# Patient Record
Sex: Male | Born: 1960 | Race: White | Hispanic: No | Marital: Married | Smoking: Never smoker
Health system: Southern US, Community
[De-identification: ages and names within clinical notes are randomized; demographics above are authoritative.]

## PROBLEM LIST (undated history)

## (undated) DIAGNOSIS — M1990 Unspecified osteoarthritis, unspecified site: Secondary | ICD-10-CM

## (undated) DIAGNOSIS — H9190 Unspecified hearing loss, unspecified ear: Secondary | ICD-10-CM

## (undated) DIAGNOSIS — K219 Gastro-esophageal reflux disease without esophagitis: Secondary | ICD-10-CM

## (undated) DIAGNOSIS — R0602 Shortness of breath: Secondary | ICD-10-CM

## (undated) DIAGNOSIS — G4730 Sleep apnea, unspecified: Secondary | ICD-10-CM

## (undated) DIAGNOSIS — I10 Essential (primary) hypertension: Secondary | ICD-10-CM

## (undated) DIAGNOSIS — R7303 Prediabetes: Secondary | ICD-10-CM

## (undated) DIAGNOSIS — T7840XA Allergy, unspecified, initial encounter: Secondary | ICD-10-CM

## (undated) DIAGNOSIS — Z9889 Other specified postprocedural states: Secondary | ICD-10-CM

## (undated) DIAGNOSIS — R112 Nausea with vomiting, unspecified: Secondary | ICD-10-CM

## (undated) HISTORY — PX: KNEE ARTHROSCOPY: SUR90

## (undated) HISTORY — DX: Allergy, unspecified, initial encounter: T78.40XA

## (undated) HISTORY — DX: Sleep apnea, unspecified: G47.30

## (undated) HISTORY — PX: SEPTOPLASTY: SUR1290

## (undated) HISTORY — PX: HERNIA REPAIR: SHX51

## (undated) HISTORY — PX: TONSILLECTOMY: SUR1361

---

## 2001-01-14 ENCOUNTER — Emergency Department (HOSPITAL_COMMUNITY): Admission: EM | Admit: 2001-01-14 | Discharge: 2001-01-14 | Payer: Self-pay | Admitting: *Deleted

## 2001-01-14 ENCOUNTER — Encounter: Payer: Self-pay | Admitting: *Deleted

## 2007-02-25 ENCOUNTER — Emergency Department (HOSPITAL_COMMUNITY): Admission: EM | Admit: 2007-02-25 | Discharge: 2007-02-25 | Payer: Self-pay | Admitting: Emergency Medicine

## 2007-03-04 ENCOUNTER — Ambulatory Visit (HOSPITAL_COMMUNITY): Admission: RE | Admit: 2007-03-04 | Discharge: 2007-03-04 | Payer: Self-pay | Admitting: Family Medicine

## 2007-03-25 ENCOUNTER — Encounter: Admission: RE | Admit: 2007-03-25 | Discharge: 2007-03-25 | Payer: Self-pay | Admitting: Orthopedic Surgery

## 2007-09-18 ENCOUNTER — Ambulatory Visit (HOSPITAL_COMMUNITY): Admission: RE | Admit: 2007-09-18 | Discharge: 2007-09-18 | Payer: Self-pay | Admitting: Family Medicine

## 2007-09-18 ENCOUNTER — Emergency Department (HOSPITAL_COMMUNITY): Admission: EM | Admit: 2007-09-18 | Discharge: 2007-09-18 | Payer: Self-pay | Admitting: Emergency Medicine

## 2008-02-17 ENCOUNTER — Encounter: Admission: RE | Admit: 2008-02-17 | Discharge: 2008-04-20 | Payer: Self-pay | Admitting: Neurology

## 2008-05-19 ENCOUNTER — Ambulatory Visit: Payer: Self-pay | Admitting: Psychology

## 2008-05-26 ENCOUNTER — Ambulatory Visit: Payer: Self-pay | Admitting: Psychology

## 2008-06-07 ENCOUNTER — Ambulatory Visit: Payer: Self-pay | Admitting: Psychology

## 2008-06-28 ENCOUNTER — Ambulatory Visit: Payer: Self-pay | Admitting: Psychology

## 2008-07-18 ENCOUNTER — Ambulatory Visit: Payer: Self-pay | Admitting: Psychology

## 2008-08-05 ENCOUNTER — Ambulatory Visit: Payer: Self-pay | Admitting: Psychology

## 2008-08-19 ENCOUNTER — Ambulatory Visit: Payer: Self-pay | Admitting: Psychology
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
## 2011-12-16 ENCOUNTER — Other Ambulatory Visit: Payer: Self-pay | Admitting: Orthopedic Surgery

## 2011-12-23 ENCOUNTER — Encounter (HOSPITAL_BASED_OUTPATIENT_CLINIC_OR_DEPARTMENT_OTHER): Payer: Self-pay | Admitting: *Deleted

## 2011-12-23 NOTE — Progress Notes (Signed)
To [facility] at 0815- [place] ,Ekg on arrival-Npo after MN-will take metoprolol with sip water that am.Woke up with slight sore throat today-will contact us if progresses or develops fever /productive cough.

## 2011-12-25 ENCOUNTER — Ambulatory Visit (HOSPITAL_BASED_OUTPATIENT_CLINIC_OR_DEPARTMENT_OTHER)
Admission: RE | Admit: 2011-12-25 | Discharge: 2011-12-25 | Disposition: A | Discharge status: Home / Self Care | Payer: 59 | Source: Ambulatory Visit | Attending: Orthopedic Surgery | Admitting: Orthopedic Surgery

## 2011-12-25 ENCOUNTER — Encounter (HOSPITAL_BASED_OUTPATIENT_CLINIC_OR_DEPARTMENT_OTHER): Payer: Self-pay

## 2011-12-25 ENCOUNTER — Encounter (HOSPITAL_BASED_OUTPATIENT_CLINIC_OR_DEPARTMENT_OTHER): Payer: Self-pay | Admitting: Anesthesiology

## 2011-12-25 ENCOUNTER — Encounter (HOSPITAL_BASED_OUTPATIENT_CLINIC_OR_DEPARTMENT_OTHER)
Admission: RE | Disposition: A | Discharge status: Home / Self Care | Payer: Self-pay | Source: Ambulatory Visit | Attending: Orthopedic Surgery

## 2011-12-25 ENCOUNTER — Ambulatory Visit (HOSPITAL_BASED_OUTPATIENT_CLINIC_OR_DEPARTMENT_OTHER): Payer: 59 | Admitting: Anesthesiology

## 2011-12-25 DIAGNOSIS — I10 Essential (primary) hypertension: Secondary | ICD-10-CM | POA: Insufficient documentation

## 2011-12-25 DIAGNOSIS — Z7982 Long term (current) use of aspirin: Secondary | ICD-10-CM | POA: Insufficient documentation

## 2011-12-25 DIAGNOSIS — Z79899 Other long term (current) drug therapy: Secondary | ICD-10-CM | POA: Insufficient documentation

## 2011-12-25 DIAGNOSIS — M6740 Ganglion, unspecified site: Secondary | ICD-10-CM | POA: Insufficient documentation

## 2011-12-25 HISTORY — DX: Shortness of breath: R06.02

## 2011-12-25 HISTORY — DX: Other specified postprocedural states: Z98.890

## 2011-12-25 HISTORY — DX: Essential (primary) hypertension: I10

## 2011-12-25 HISTORY — DX: Other specified postprocedural states: R11.2

## 2011-12-25 HISTORY — PX: GANGLION CYST EXCISION: SHX1691

## 2011-12-25 LAB — POCT I-STAT 4, (NA,K, GLUC, HGB,HCT)
Glucose, Bld: 114 mg/dL — ABNORMAL HIGH (ref 70–99)
Potassium: 4 mEq/L (ref 3.5–5.1)

## 2011-12-25 SURGERY — EXCISION, GANGLION CYST, WRIST
Anesthesia: General | Site: Hand | Laterality: Left | Wound class: Clean

## 2011-12-25 MED ORDER — LIDOCAINE HCL (CARDIAC) 20 MG/ML IV SOLN
INTRAVENOUS | Status: DC | PRN
  Administered 2011-12-25: 75 mg via INTRAVENOUS

## 2011-12-25 MED ORDER — EPHEDRINE SULFATE 50 MG/ML IJ SOLN
INTRAMUSCULAR | Status: DC | PRN
  Administered 2011-12-25 (×2): 15 mg via INTRAVENOUS

## 2011-12-25 MED ORDER — FENTANYL CITRATE 0.05 MG/ML IJ SOLN
INTRAMUSCULAR | Status: DC | PRN
  Administered 2011-12-25 (×4): 25 ug via INTRAVENOUS
  Administered 2011-12-25: 50 ug via INTRAVENOUS
  Administered 2011-12-25: 25 ug via INTRAVENOUS
  Administered 2011-12-25: 50 ug via INTRAVENOUS

## 2011-12-25 MED ORDER — LACTATED RINGERS IV SOLN
INTRAVENOUS | Status: DC
  Administered 2011-12-25: 09:00:00 via INTRAVENOUS

## 2011-12-25 MED ORDER — PROMETHAZINE HCL 25 MG/ML IJ SOLN: 6.2500 mg | INTRAMUSCULAR | Status: DC | PRN

## 2011-12-25 MED ORDER — LACTATED RINGERS IV SOLN
INTRAVENOUS | Status: DC | PRN
  Administered 2011-12-25 (×2): via INTRAVENOUS

## 2011-12-25 MED ORDER — HYDROCODONE-ACETAMINOPHEN 5-325 MG PO TABS: 1.0000 | ORAL_TABLET | Freq: Four times a day (QID) | ORAL | Status: AC | PRN

## 2011-12-25 MED ORDER — HYDROCODONE-ACETAMINOPHEN 5-325 MG PO TABS
1.0000 | ORAL_TABLET | Freq: Four times a day (QID) | ORAL | Status: DC | PRN
  Administered 2011-12-25: 1 via ORAL

## 2011-12-25 MED ORDER — POVIDONE-IODINE 7.5 % EX SOLN
Freq: Once | CUTANEOUS | Status: AC
  Administered 2011-12-25: 09:00:00 via TOPICAL

## 2011-12-25 MED ORDER — MIDAZOLAM HCL 5 MG/5ML IJ SOLN
INTRAMUSCULAR | Status: DC | PRN
  Administered 2011-12-25: 1 mg via INTRAVENOUS

## 2011-12-25 MED ORDER — ACETAMINOPHEN 10 MG/ML IV SOLN: 1000.0000 mg | Freq: Once | INTRAVENOUS | Status: DC | PRN

## 2011-12-25 MED ORDER — HEMOSTATIC AGENTS (NO CHARGE) OPTIME
TOPICAL | Status: DC | PRN
  Administered 2011-12-25: 1 via TOPICAL

## 2011-12-25 MED ORDER — MEPERIDINE HCL 25 MG/ML IJ SOLN: 6.2500 mg | INTRAMUSCULAR | Status: DC | PRN

## 2011-12-25 MED ORDER — OXYCODONE HCL 5 MG PO TABS: 5.0000 mg | ORAL_TABLET | Freq: Once | ORAL | Status: DC | PRN

## 2011-12-25 MED ORDER — DEXAMETHASONE SODIUM PHOSPHATE 4 MG/ML IJ SOLN
INTRAMUSCULAR | Status: DC | PRN
  Administered 2011-12-25: 10 mg via INTRAVENOUS

## 2011-12-25 MED ORDER — SCOPOLAMINE 1 MG/3DAYS TD PT72
1.0000 | MEDICATED_PATCH | TRANSDERMAL | Status: DC
  Administered 2011-12-25: 1.5 mg via TRANSDERMAL

## 2011-12-25 MED ORDER — PROMETHAZINE HCL 50 MG PO TABS: 50.0000 mg | ORAL_TABLET | Freq: Four times a day (QID) | ORAL | Status: DC | PRN

## 2011-12-25 MED ORDER — OXYCODONE HCL 5 MG/5ML PO SOLN: 5.0000 mg | Freq: Once | ORAL | Status: DC | PRN

## 2011-12-25 MED ORDER — PROPOFOL 10 MG/ML IV EMUL
INTRAVENOUS | Status: DC | PRN
  Administered 2011-12-25: 200 mg via INTRAVENOUS
  Administered 2011-12-25: 60 mg via INTRAVENOUS

## 2011-12-25 MED ORDER — TOPICALS DOCUMENTATION OPTIME: TOPICAL | Status: DC | PRN

## 2011-12-25 MED ORDER — HYDROMORPHONE HCL PF 1 MG/ML IJ SOLN
0.2500 mg | INTRAMUSCULAR | Status: DC | PRN
Start: 2011-12-25 — End: 2011-12-25

## 2011-12-25 MED ORDER — ONDANSETRON HCL 4 MG/2ML IJ SOLN
INTRAMUSCULAR | Status: DC | PRN
  Administered 2011-12-25: 4 mg via INTRAVENOUS

## 2011-12-25 SURGICAL SUPPLY — 57 items
BANDAGE CONFORM 3  STR LF (GAUZE/BANDAGES/DRESSINGS) ×2 IMPLANT
BANDAGE ELASTIC 3 VELCRO ST LF (GAUZE/BANDAGES/DRESSINGS) ×1 IMPLANT
BANDAGE ELASTIC 4 VELCRO ST LF (GAUZE/BANDAGES/DRESSINGS) ×1 IMPLANT
BLADE SURG 15 STRL LF DISP TIS (BLADE) ×1 IMPLANT
BLADE SURG 15 STRL SS (BLADE) ×2
BNDG CMPR 9X4 STRL LF SNTH (GAUZE/BANDAGES/DRESSINGS) ×1
BNDG ESMARK 4X9 LF (GAUZE/BANDAGES/DRESSINGS) ×2 IMPLANT
CLOTH BEACON ORANGE TIMEOUT ST (SAFETY) ×2 IMPLANT
CORDS BIPOLAR (ELECTRODE) ×2 IMPLANT
COVER TABLE BACK 60X90 (DRAPES) ×2 IMPLANT
DRAPE EXTREMITY T 121X128X90 (DRAPE) ×2 IMPLANT
DRAPE LG THREE QUARTER DISP (DRAPES) ×3 IMPLANT
DRAPE U-SHAPE 47X51 STRL (DRAPES) ×2 IMPLANT
DRSG EMULSION OIL 3X3 NADH (GAUZE/BANDAGES/DRESSINGS) ×2 IMPLANT
DURAPREP 26ML APPLICATOR (WOUND CARE) ×2 IMPLANT
ELECT REM PT RETURN 9FT ADLT (ELECTROSURGICAL) ×2
ELECTRODE REM PT RTRN 9FT ADLT (ELECTROSURGICAL) IMPLANT
GLOVE BIOGEL PI IND STRL 8 (GLOVE) ×1 IMPLANT
GLOVE BIOGEL PI INDICATOR 8 (GLOVE)
GLOVE ECLIPSE 6.0 STRL STRAW (GLOVE) ×1 IMPLANT
GLOVE ECLIPSE 8.0 STRL XLNG CF (GLOVE) ×2 IMPLANT
GLOVE INDICATOR 6.5 STRL GRN (GLOVE) ×2 IMPLANT
GLOVE INDICATOR 8.0 STRL GRN (GLOVE) ×2 IMPLANT
GOWN STRL NON-REIN LRG LVL3 (GOWN DISPOSABLE) ×3 IMPLANT
GOWN STRL REIN XL XLG (GOWN DISPOSABLE) ×2 IMPLANT
MARKER SKIN DUAL TIP RULER LAB (MISCELLANEOUS) ×2 IMPLANT
NDL HYPO 25X1 1.5 SAFETY (NEEDLE) IMPLANT
NEEDLE HYPO 22GX1.5 SAFETY (NEEDLE) IMPLANT
NEEDLE HYPO 25X1 1.5 SAFETY (NEEDLE) IMPLANT
NS IRRIG 500ML POUR BTL (IV SOLUTION) ×2 IMPLANT
PACK BASIN DAY SURGERY FS (CUSTOM PROCEDURE TRAY) ×2 IMPLANT
PAD CAST 3X4 CTTN HI CHSV (CAST SUPPLIES) ×1 IMPLANT
PADDING CAST ABS 4INX4YD NS (CAST SUPPLIES) ×1
PADDING CAST ABS COTTON 4X4 ST (CAST SUPPLIES) ×1 IMPLANT
PADDING CAST COTTON 3X4 STRL (CAST SUPPLIES) ×2
PENCIL BUTTON HOLSTER BLD 10FT (ELECTRODE) IMPLANT
SPLINT PLASTER CAST XFAST 3X15 (CAST SUPPLIES) IMPLANT
SPLINT PLASTER XTRA FASTSET 3X (CAST SUPPLIES) ×1
SPONGE GAUZE 4X4 12PLY (GAUZE/BANDAGES/DRESSINGS) ×2 IMPLANT
SPONGE SURGIFOAM ABS GEL 12-7 (HEMOSTASIS) ×1 IMPLANT
STOCKINETTE 4X48 STRL (DRAPES) ×2 IMPLANT
SUT ETHILON 4 0 PS 2 18 (SUTURE) ×2 IMPLANT
SUT VIC AB 2-0 SH 27 (SUTURE)
SUT VIC AB 2-0 SH 27XBRD (SUTURE) IMPLANT
SUT VIC AB 2-0 UR6 27 (SUTURE) IMPLANT
SUT VIC AB 3-0 PS2 18 (SUTURE)
SUT VIC AB 3-0 PS2 18XBRD (SUTURE) IMPLANT
SUT VIC AB 4-0 RB1 27 (SUTURE) ×2
SUT VIC AB 4-0 RB1 27X BRD (SUTURE) IMPLANT
SUT VIC AB 4-0 SH 27 (SUTURE) ×2
SUT VIC AB 4-0 SH 27XANBCTRL (SUTURE) IMPLANT
SYR BULB IRRIGATION 50ML (SYRINGE) ×2 IMPLANT
SYR CONTROL 10ML LL (SYRINGE) IMPLANT
TOWEL NATURAL 6PK STERILE (DISPOSABLE) ×1 IMPLANT
TOWEL OR 17X24 6PK STRL BLUE (TOWEL DISPOSABLE) ×2 IMPLANT
UNDERPAD 30X30 INCONTINENT (UNDERPADS AND DIAPERS) ×2 IMPLANT
WATER STERILE IRR 500ML POUR (IV SOLUTION) ×1 IMPLANT

## 2011-12-25 NOTE — H&P (Signed)
Brandon Giles is an 51 y.o. male.   Chief Complaint: painful lt wrist HPI: progressive swelling and pain,now some numbness over radial artery lt wrist  Past Medical History  Diagnosis Date  . PONV (postoperative nausea and vomiting)     after hernia repair  . Hypertension   . Shortness of breath     with exercise-states 'out of shape'    Past Surgical History  Procedure Date  . Hernia repair mid '90's    bilateral  . Knee arthroscopy '99    left     History reviewed. No pertinent family history. Social History:  reports that he quit smoking about 15 years ago. His smoking use included Pipe. He does not have any smokeless tobacco history on file. He reports that he does not drink alcohol. His drug history not on file.  Allergies: No Known Allergies  Medications Prior to Admission  Medication Sig Dispense Refill  . aspirin 81 MG tablet Take 81 mg by mouth daily.      Marland Kitchen b complex vitamins tablet Take 1 tablet by mouth daily.      . fish oil-omega-3 fatty acids 1000 MG capsule Take 1 g by mouth daily.      . fluticasone (FLONASE) 50 MCG/ACT nasal spray Place 2 sprays into the nose daily.      Marland Kitchen loratadine (CLARITIN) 10 MG tablet Take 10 mg by mouth every evening.      . metoprolol tartrate (LOPRESSOR) 25 MG tablet Take 25 mg by mouth every morning.      . naproxen sodium (ANAPROX) 220 MG tablet Take 220 mg by mouth as needed.      Marland Kitchen telmisartan (MICARDIS) 40 MG tablet Take 40 mg by mouth every evening.      . beta carotene w/minerals (OCUVITE) tablet Take 1 tablet by mouth daily.        Results for orders placed during the hospital encounter of 12/25/11 (from the past 48 hour(s))  POCT I-STAT 4, (NA,K, GLUC, HGB,HCT)     Status: Abnormal   Collection Time   12/25/11  8:58 AM      Component Value Range Comment   Sodium 140  135 - 145 mEq/L    Potassium 4.0  3.5 - 5.1 mEq/L    Glucose, Bld 114 (*) 70 - 99 mg/dL    HCT 45.4  09.8 - 11.9 %    Hemoglobin 15.0  13.0 - 17.0  g/dL    No results found.  ROS  Height 5\' 8"  (1.727 m), weight 87.091 kg (192 lb). Physical Exam  Constitutional: He is oriented to person, place, and time. He appears well-developed and well-nourished.  HENT:  Head: Normocephalic and atraumatic.  Right Ear: External ear normal.  Left Ear: External ear normal.  Nose: Nose normal.  Mouth/Throat: Oropharynx is clear and moist.  Eyes: Conjunctivae and EOM are normal. Pupils are equal, round, and reactive to light.  Neck: Normal range of motion. Neck supple.  Cardiovascular: Normal rate, regular rhythm, normal heart sounds and intact distal pulses.   Respiratory: Effort normal and breath sounds normal.  GI: Soft. Bowel sounds are normal.  Musculoskeletal: Normal range of motion.       Large mass flexor lt wrist overly radial artery  Neurological: He is alert and oriented to person, place, and time. He has normal reflexes.  Skin: Skin is warm and dry. There is erythema.       Superficial red areas on nose--pt states due to  allergy  Psychiatric: He has a normal mood and affect. His behavior is normal. Judgment and thought content normal.     Assessment/Plan Ganglion cyst lt wrist Excision cyst  Vega Withrow P 12/25/2011, 10:18 AM

## 2011-12-25 NOTE — Transfer of Care (Signed)
Immediate Anesthesia Transfer of Care Note  Patient: Brandon Giles  Procedure(s) Performed: Procedure(s) (LRB): REMOVAL GANGLION OF WRIST (Left)  Patient Location: Patient transported to PACU with oxygen via face mask at 4 Liters / Min  Anesthesia Type: General  Level of Consciousness: awake and alert   Airway & Oxygen Therapy: Patient Spontanous Breathing and Patient connected to face mask oxygen  Post-op Assessment: Report given to PACU RN and Post -op Vital signs reviewed and stable  Post vital signs: Reviewed and stable  Dentition: Teeth and oropharynx remain in pre-op condition  Complications: No apparent anesthesia complications

## 2011-12-25 NOTE — Anesthesia Procedure Notes (Addendum)
Procedure Name: LMA Insertion Date/Time: 12/25/2011 10:29 AM Performed by: Fran Lowes Pre-anesthesia Checklist: Patient identified, Emergency Drugs available, Suction available and Patient being monitored Patient Re-evaluated:Patient Re-evaluated prior to inductionOxygen Delivery Method: Circle System Utilized Preoxygenation: Pre-oxygenation with 100% oxygen Intubation Type: IV induction Ventilation: Mask ventilation without difficulty LMA: LMA inserted LMA Size: 4.0 Number of attempts: 1 Airway Equipment and Method: bite block Placement Confirmation: positive ETCO2 Tube secured with: Tape Dental Injury: Teeth and Oropharynx as per pre-operative assessment

## 2011-12-25 NOTE — Brief Op Note (Signed)
12/25/2011  11:40 AM  PATIENT:  Brandon Giles  51 y.o. male  PRE-OPERATIVE DIAGNOSIS:  left wrist ganglion cyst  POST-OPERATIVE DIAGNOSIS:  left wrist ganglion cyst  PROCEDURE:  Procedure(s) (LRB): REMOVAL GANGLION OF WRIST (Left)  SURGEON:  Surgeon(s) and Role:    * Drucilla Schmidt, MD - Primary  PHYSICIAN ASSISTANT:   ASSISTANTS:nurse  ANESTHESIA:   general  EBL:  Total I/O In: 1000 [I.V.:1000] Out: -   BLOOD ADMINISTERED:none  DRAINS: none   LOCAL MEDICATIONS USED:  NONE  SPECIMEN:  No Specimen  DISPOSITION OF SPECIMEN:  N/A  COUNTS:  YES  TOURNIQUET:   Total Tourniquet Time Documented: Upper Arm (Left) - 38 minutes  DICTATION: .Other Dictation: Dictation Number [id number]  PLAN OF CARE: Discharge to home after PACU  PATIENT DISPOSITION:  PACU - hemodynamically stable.   Delay start of Pharmacological VTE agent (>24hrs) due to surgical blood loss or risk of bleeding: not applicable

## 2011-12-25 NOTE — Anesthesia Postprocedure Evaluation (Signed)
Anesthesia Post Note  Patient: Brandon Giles  Procedure(s) Performed: Procedure(s) (LRB): REMOVAL GANGLION OF WRIST (Left)  Anesthesia type: General  Patient location: PACU  Post pain: Pain level controlled  Post assessment: Post-op Vital signs reviewed  Last Vitals: BP 149/86  Temp 36.1 C  Resp 12  Ht 5\' 8"  (1.727 m)  Wt 192 lb (87.091 kg)  BMI 29.19 kg/m2  Post vital signs: Reviewed  Level of consciousness: sedated  Complications: No apparent anesthesia complications

## 2011-12-25 NOTE — Anesthesia Preprocedure Evaluation (Addendum)
Anesthesia Evaluation  Patient identified by MRN, date of birth, ID band Patient awake    Reviewed: Allergy & Precautions, H&P , NPO status , Patient's Chart, lab work & pertinent test results, reviewed documented beta blocker date and time   History of Anesthesia Complications (+) PONV  Airway Mallampati: I TM Distance: >3 FB Neck ROM: Full    Dental  (+) Teeth Intact and Dental Advisory Given   Pulmonary neg pulmonary ROS,  breath sounds clear to auscultation  Pulmonary exam normal       Cardiovascular hypertension, Pt. on medications and Pt. on home beta blockers Rhythm:Regular Rate:Bradycardia     Neuro/Psych    GI/Hepatic negative GI ROS, Neg liver ROS,   Endo/Other  negative endocrine ROS  Renal/GU negative Renal ROS     Musculoskeletal   Abdominal (+) - obese,  Abdomen: soft.    Peds  Hematology negative hematology ROS (+)   Anesthesia Other Findings   Reproductive/Obstetrics                          Anesthesia Physical Anesthesia Plan  ASA: II  Anesthesia Plan: General   Post-op Pain Management:    Induction: Intravenous  Airway Management Planned: LMA  Additional Equipment:   Intra-op Plan:   Post-operative Plan: Extubation in OR  Informed Consent: I have reviewed the patients History and Physical, chart, labs and discussed the procedure including the risks, benefits and alternatives for the proposed anesthesia with the patient or authorized representative who has indicated his/her understanding and acceptance.   Dental advisory given  Plan Discussed with: Surgeon and CRNA  Anesthesia Plan Comments:         Anesthesia Quick Evaluation

## 2011-12-26 ENCOUNTER — Encounter (HOSPITAL_BASED_OUTPATIENT_CLINIC_OR_DEPARTMENT_OTHER): Payer: Self-pay | Admitting: Orthopedic Surgery

## 2011-12-26 NOTE — Op Note (Signed)
NAMELEOPOLD, SMYERS NO.:  [id number]  MEDICAL RECORD NO.:  [id number]  LOCATION:                               FACILITY:  [facility]  PHYSICIAN:  Marlowe Kays, M.D.  DATE OF BIRTH:  [birth year]  DATE OF PROCEDURE:  12/25/2011 DATE OF DISCHARGE:                              OPERATIVE REPORT   PREOPERATIVE DIAGNOSIS:  Painful progressively enlarging ganglion cyst over the flexor radial left wrist.  POSTOPERATIVE DIAGNOSIS:  Painful progressively enlarging ganglion cyst over the flexor radial left wrist.  OPERATION:  Excision of ganglion flexor radial left wrist.  SURGEON:  Marlowe Kays, MD  ASSISTANT:  Nurse.  ANESTHESIA:  General.  JUSTIFICATION FOR PROCEDURE:  As stated in diagnosis.  The mass is nonpulsatile.  PROCEDURE:  Satisfactory general anesthesia.  Betadine prepped from midforearm to fingertips which were draped in a sterile field.  Time-out performed.  I made a vertical incision in line with the radial artery. The cyst was extremely large and once through the skin and superficial subcutaneous tissue and it was very apparent.  It was obviously benign, then carefully began dissecting out the cyst from the surrounding tissues.  Potential bleeders were coagulated with bipolar cautery.  The cyst was followed down distally into one of the intercarpal joints and which I after sectioning it, I then closed this parent site of origin with interrupted 4-0 Vicryl sutures.  The wound was irrigated with sterile saline.  I let the tourniquet down.  There was only minimal bleeding which I coagulated with bipolar cautery.  I placed some Gelfoam in the depths of the wound for additional hemostasis.  The subcutaneous tissue closed with interrupted 4-0 Vicryl, and the skin with running 4-0 nylon.  Betadine, Adaptic, dry sterile dressing and a splint cast with a thumb _extension_________.  He tolerated the procedure well.  At the time of this dictation he was  transported to recovery room in satisfactory condition with no known complications.          ______________________________ Marlowe Kays, M.D.     JA/MEDQ  D:  12/25/2011  T:  12/26/2011  Job:  [id number]

## 2013-04-05 ENCOUNTER — Encounter: Payer: Self-pay | Admitting: Internal Medicine

## 2013-04-06 ENCOUNTER — Encounter (INDEPENDENT_AMBULATORY_CARE_PROVIDER_SITE_OTHER): Payer: Self-pay | Admitting: *Deleted

## 2013-05-28 ENCOUNTER — Ambulatory Visit (AMBULATORY_SURGERY_CENTER): Payer: Self-pay | Admitting: *Deleted

## 2013-05-28 VITALS — Ht 69.0 in | Wt 195.6 lb

## 2013-05-28 DIAGNOSIS — Z1211 Encounter for screening for malignant neoplasm of colon: Secondary | ICD-10-CM

## 2013-05-28 MED ORDER — MOVIPREP 100 G PO SOLR: 1.0000 | Freq: Once | ORAL | Status: DC

## 2013-05-28 NOTE — H&P (Signed)
Assessment  Deviated nasal septum (470) (J34.2). Allergic rhinitis due to pollen (477.0) (J30.1). Cerumen impaction (380.4) (H61.20). 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 Nasal obstruction (478.19) (J34.89). Discussed  Deviated nasal septum, turbinate hyperplasia. He is on maximal medical therapy for allergies. No signs of sinus disease. Consider nasal septoplasty with reduction of turbinates as his next option. Reason For Visit  Pt here for evaluation of sinus problems and not able to breath through nose. HPI  Lifelong history of allergies and chronic rhinitis. He's had trouble breathing through his nose for many years but especially bad the past couple of years. He is on nasal steroid inhaler, antihistamines, nasal saline, oral decongestants, but continues to have trouble. No known history of traumatic injury to the nose. He has had allergy testing a few times over the years and is positive for multiple items. He also has chronic discharge from his nose and sinus pain and pressure. He worked in [employer] and has hearing loss especially on the left side. He uses Q-tips. Allergies  OxyCODONE HCl TABS; Tachycardia. Current Meds  Multi-Vitamin Oral Tablet;; RPT Metoprolol Tartrate TABS;; RPT Aspir-81 TBEC;; RPT Loratadine TABS;; RPT Cialis TABS;; RPT Fluticasone Propionate SUSP;; RPT Telmisartan TABS;; RPT. Active Problems  High cholesterol   (272.0) (E78.0) Hypertension   (401.9) (I10). Tiger  Hernia Repair Knee Surgery Oral Surgery Tonsillectomy Wrist Surgery. Family Hx  Family history of cardiac disorder: Mother,Grandparent (V17.49) (Z82.49) Family history of diabetes mellitus: Mother,Grandparent (V18.0) (Z83.3) Family history of hypertension: Mother,Grandparent (V17.49) (Z82.49). Personal Hx  Caffeine use (V49.89) (F15.929) Never a smoker (V49.89) (Z78.9) No alcohol use. ROS  Systemic: Feeling tired (fatigue).  No fever, no night sweats, and no recent weight  loss. Head: No headache. Eyes: No eye symptoms. Otolaryngeal: Hearing loss.  No earache.  Tinnitus.  No purulent nasal discharge.  Nasal passage blockage (stuffiness), snoring, and sneezing.  No hoarseness  and no sore throat. Cardiovascular: No chest pain or discomfort  and no palpitations. Pulmonary: No dyspnea, no cough, and no wheezing. Gastrointestinal: No dysphagia  and no heartburn.  No nausea, no abdominal pain, and no melena.  No diarrhea. Genitourinary: No dysuria. Endocrine: No muscle weakness. Musculoskeletal: No calf muscle cramps, no arthralgias, and no soft tissue swelling. Neurological: No dizziness, no fainting, no tingling, and no numbness. Psychological: No anxiety  and no depression. Skin: No rash. 12 system ROS was obtained and reviewed on the Health Maintenance form dated today.  Positive responses are shown above.  If the symptom is not checked, the patient has denied it. Vital Signs   Recorded by Sallyanne Kuster on 19 May 2013 09:42 AM BP:131/80,  HR: 52 b/min,  Height: 5 ft 9 in, Weight: 198 lb , BMI: 29.2 kg/m2,  BMI Calculated: 29.24 ,  BSA Calculated: 2.06. Physical Exam  APPEARANCE: Well developed, well nourished, in no acute distress.  Normal affect, in a pleasant mood.  Oriented to time, place and person. COMMUNICATION: Normal voice   HEAD & FACE:  No scars, lesions or masses of head and face.  Sinuses nontender to palpation.  Salivary glands without mass or tenderness.  Facial strength symmetric.  No facial lesion, scars, or mass. EYES: EOMI with normal primary gaze alignment. Visual acuity grossly intact.  PERRLA EXTERNAL EAR & NOSE: No scars, lesions or masses  EAC & TYMPANIC MEMBRANE: Bilateral cerumen impaction cleaned out under the microscope. The tympanic membranes are normal bilaterally with good movement to insufflation. GROSS HEARING: Improved after the cleaning but still better on  the right. TMJ:  Nontender  INTRANASAL EXAM: Rightward septal  deviation with near complete obstruction on the right, bilateral mucosal edema and mucous exudate, no polyp seen.  NASOPHARYNX: Normal, without lesions. LIPS, TEETH & GUMS: No lip lesions, normal dentition and normal gums. ORAL CAVITY/OROPHARYNX:  Oral mucosa moist without lesion or asymmetry of the palate, tongue, tonsil or posterior pharynx. NECK:  Supple without adenopathy or mass. THYROID:  Normal with no masses palpable.  NEUROLOGIC:  No gross CN deficits. No nystagmus noted.   LYMPHATIC:  No enlarged nodes palpable. Results  Nasal septal deviation, otherwise negative CT of sinuses. Signature  Electronically signed by : Izora Gala  M.D.; 05/19/2013 10:56 AM EST.

## 2013-05-28 NOTE — Progress Notes (Signed)
No egg or soy allergy. ewm No home 02 and no cpap use. ewm Pt has hx of post op nausea/vomiting. ewm Pt declined emmi video. ewm

## 2013-05-31 ENCOUNTER — Encounter (HOSPITAL_BASED_OUTPATIENT_CLINIC_OR_DEPARTMENT_OTHER): Payer: Self-pay | Admitting: *Deleted

## 2013-05-31 NOTE — Progress Notes (Signed)
Pt detective Fors[facility]-will go to [facility] for [id number]

## 2013-06-01 ENCOUNTER — Encounter: Payer: Self-pay | Admitting: Internal Medicine

## 2013-06-02 ENCOUNTER — Other Ambulatory Visit: Payer: Self-pay

## 2013-06-02 ENCOUNTER — Encounter (HOSPITAL_COMMUNITY)
Admission: RE | Admit: 2013-06-02 | Discharge: 2013-06-02 | Disposition: A | Discharge status: Home / Self Care | Payer: 59 | Source: Ambulatory Visit | Attending: Otolaryngology | Admitting: Otolaryngology

## 2013-06-02 LAB — BASIC METABOLIC PANEL
BUN: 17 mg/dL (ref 6–23)
CHLORIDE: 103 meq/L (ref 96–112)
CO2: 26 mEq/L (ref 19–32)
Calcium: 9.8 mg/dL (ref 8.4–10.5)
Creatinine, Ser: 1.05 mg/dL (ref 0.50–1.35)
GFR calc non Af Amer: 81 mL/min — ABNORMAL LOW (ref >90)
Glucose, Bld: 110 mg/dL — ABNORMAL HIGH (ref 70–99)
POTASSIUM: 4 meq/L (ref 3.7–5.3)
Sodium: 142 mEq/L (ref 137–147)

## 2013-06-04 ENCOUNTER — Encounter (HOSPITAL_BASED_OUTPATIENT_CLINIC_OR_DEPARTMENT_OTHER): Payer: Self-pay | Admitting: *Deleted

## 2013-06-04 ENCOUNTER — Ambulatory Visit (HOSPITAL_BASED_OUTPATIENT_CLINIC_OR_DEPARTMENT_OTHER): Payer: 59 | Admitting: Anesthesiology

## 2013-06-04 ENCOUNTER — Ambulatory Visit (HOSPITAL_BASED_OUTPATIENT_CLINIC_OR_DEPARTMENT_OTHER)
Admission: RE | Admit: 2013-06-04 | Discharge: 2013-06-04 | Disposition: A | Discharge status: Home / Self Care | Payer: 59 | Source: Ambulatory Visit | Attending: Otolaryngology | Admitting: Otolaryngology

## 2013-06-04 ENCOUNTER — Encounter (HOSPITAL_BASED_OUTPATIENT_CLINIC_OR_DEPARTMENT_OTHER): Payer: 59 | Admitting: Anesthesiology

## 2013-06-04 ENCOUNTER — Encounter (HOSPITAL_BASED_OUTPATIENT_CLINIC_OR_DEPARTMENT_OTHER)
Admission: RE | Disposition: A | Discharge status: Home / Self Care | Payer: Self-pay | Source: Ambulatory Visit | Attending: Otolaryngology

## 2013-06-04 DIAGNOSIS — J343 Hypertrophy of nasal turbinates: Secondary | ICD-10-CM | POA: Insufficient documentation

## 2013-06-04 DIAGNOSIS — J309 Allergic rhinitis, unspecified: Secondary | ICD-10-CM | POA: Insufficient documentation

## 2013-06-04 DIAGNOSIS — E7800 Pure hypercholesterolemia, unspecified: Secondary | ICD-10-CM | POA: Insufficient documentation

## 2013-06-04 DIAGNOSIS — H9190 Unspecified hearing loss, unspecified ear: Secondary | ICD-10-CM | POA: Insufficient documentation

## 2013-06-04 DIAGNOSIS — H6120 Impacted cerumen, unspecified ear: Secondary | ICD-10-CM | POA: Insufficient documentation

## 2013-06-04 DIAGNOSIS — I10 Essential (primary) hypertension: Secondary | ICD-10-CM | POA: Insufficient documentation

## 2013-06-04 DIAGNOSIS — J3489 Other specified disorders of nose and nasal sinuses: Secondary | ICD-10-CM | POA: Insufficient documentation

## 2013-06-04 DIAGNOSIS — J342 Deviated nasal septum: Secondary | ICD-10-CM

## 2013-06-04 HISTORY — PX: NASAL SEPTOPLASTY W/ TURBINOPLASTY: SHX2070

## 2013-06-04 HISTORY — DX: Unspecified hearing loss, unspecified ear: H91.90

## 2013-06-04 LAB — POCT HEMOGLOBIN-HEMACUE: Hemoglobin: 14.7 g/dL (ref 13.0–17.0)

## 2013-06-04 SURGERY — SEPTOPLASTY, NOSE, WITH NASAL TURBINATE REDUCTION
Anesthesia: General | Site: Nose | Laterality: Bilateral

## 2013-06-04 MED ORDER — FENTANYL CITRATE 0.05 MG/ML IJ SOLN: 50.0000 ug | INTRAMUSCULAR | Status: DC | PRN

## 2013-06-04 MED ORDER — BACITRACIN ZINC 500 UNIT/GM EX OINT
TOPICAL_OINTMENT | CUTANEOUS | Status: DC | PRN
  Administered 2013-06-04: 1 via TOPICAL

## 2013-06-04 MED ORDER — MIDAZOLAM HCL 2 MG/2ML IJ SOLN: 1.0000 mg | INTRAMUSCULAR | Status: DC | PRN

## 2013-06-04 MED ORDER — FENTANYL CITRATE 0.05 MG/ML IJ SOLN
INTRAMUSCULAR | Status: DC | PRN
  Administered 2013-06-04: 100 ug via INTRAVENOUS

## 2013-06-04 MED ORDER — OXYCODONE HCL 5 MG PO TABS: 5.0000 mg | ORAL_TABLET | Freq: Once | ORAL | Status: DC | PRN

## 2013-06-04 MED ORDER — MIDAZOLAM HCL 5 MG/5ML IJ SOLN
INTRAMUSCULAR | Status: DC | PRN
  Administered 2013-06-04: 2 mg via INTRAVENOUS

## 2013-06-04 MED ORDER — HYDROMORPHONE HCL PF 1 MG/ML IJ SOLN
0.2500 mg | INTRAMUSCULAR | Status: DC | PRN
  Administered 2013-06-04 (×2): 0.5 mg via INTRAVENOUS

## 2013-06-04 MED ORDER — OXYMETAZOLINE HCL 0.05 % NA SOLN
NASAL | Status: AC
  Filled 2013-06-04: qty 15

## 2013-06-04 MED ORDER — PROMETHAZINE HCL 25 MG RE SUPP: 25.0000 mg | Freq: Four times a day (QID) | RECTAL | Status: DC | PRN

## 2013-06-04 MED ORDER — OXYMETAZOLINE HCL 0.05 % NA SOLN
NASAL | Status: DC | PRN
  Administered 2013-06-04: 1 via NASAL

## 2013-06-04 MED ORDER — HYDROMORPHONE HCL PF 1 MG/ML IJ SOLN
INTRAMUSCULAR | Status: AC
  Filled 2013-06-04: qty 1

## 2013-06-04 MED ORDER — CEPHALEXIN 500 MG PO CAPS: 500.0000 mg | ORAL_CAPSULE | Freq: Three times a day (TID) | ORAL | Status: DC

## 2013-06-04 MED ORDER — DEXAMETHASONE SODIUM PHOSPHATE 4 MG/ML IJ SOLN
INTRAMUSCULAR | Status: DC | PRN
  Administered 2013-06-04: 10 mg via INTRAVENOUS

## 2013-06-04 MED ORDER — LIDOCAINE-EPINEPHRINE 1 %-1:100000 IJ SOLN
INTRAMUSCULAR | Status: AC
  Filled 2013-06-04: qty 1

## 2013-06-04 MED ORDER — PROMETHAZINE HCL 25 MG/ML IJ SOLN: 6.2500 mg | INTRAMUSCULAR | Status: DC | PRN

## 2013-06-04 MED ORDER — FENTANYL CITRATE 0.05 MG/ML IJ SOLN
INTRAMUSCULAR | Status: AC
Start: 2013-06-04 — End: 2013-06-04
  Filled 2013-06-04: qty 4

## 2013-06-04 MED ORDER — LIDOCAINE-EPINEPHRINE 1 %-1:100000 IJ SOLN
INTRAMUSCULAR | Status: DC | PRN
  Administered 2013-06-04: 8 mL

## 2013-06-04 MED ORDER — ONDANSETRON HCL 4 MG/2ML IJ SOLN
INTRAMUSCULAR | Status: DC | PRN
  Administered 2013-06-04: 4 mg via INTRAVENOUS

## 2013-06-04 MED ORDER — PROPOFOL 10 MG/ML IV BOLUS
INTRAVENOUS | Status: DC | PRN
  Administered 2013-06-04: 200 mg via INTRAVENOUS

## 2013-06-04 MED ORDER — TRAMADOL HCL 50 MG PO TABS: 50.0000 mg | ORAL_TABLET | Freq: Four times a day (QID) | ORAL | Status: DC | PRN

## 2013-06-04 MED ORDER — OXYCODONE HCL 5 MG/5ML PO SOLN: 5.0000 mg | Freq: Once | ORAL | Status: DC | PRN

## 2013-06-04 MED ORDER — SUCCINYLCHOLINE CHLORIDE 20 MG/ML IJ SOLN
INTRAMUSCULAR | Status: DC | PRN
  Administered 2013-06-04: 100 mg via INTRAVENOUS

## 2013-06-04 MED ORDER — LACTATED RINGERS IV SOLN
INTRAVENOUS | Status: DC
  Administered 2013-06-04 (×2): via INTRAVENOUS

## 2013-06-04 MED ORDER — MIDAZOLAM HCL 2 MG/2ML IJ SOLN
INTRAMUSCULAR | Status: AC
  Filled 2013-06-04: qty 2

## 2013-06-04 MED ORDER — BACITRACIN ZINC 500 UNIT/GM EX OINT
TOPICAL_OINTMENT | CUTANEOUS | Status: AC
  Filled 2013-06-04: qty 28.35

## 2013-06-04 SURGICAL SUPPLY — 34 items
ATTRACTOMAT 16X20 MAGNETIC DRP (DRAPES) IMPLANT
CANISTER SUCT 1200ML W/VALVE (MISCELLANEOUS) ×3 IMPLANT
CLOSURE WOUND 1/2 X4 (GAUZE/BANDAGES/DRESSINGS)
COVER TABLE BACK 60X90 (DRAPES) ×2 IMPLANT
DECANTER SPIKE VIAL GLASS SM (MISCELLANEOUS) IMPLANT
DRAPE U-SHAPE 76X120 STRL (DRAPES) ×2 IMPLANT
DRSG NASOPORE 8CM (GAUZE/BANDAGES/DRESSINGS) IMPLANT
DRSG TELFA 3X8 NADH (GAUZE/BANDAGES/DRESSINGS) ×3 IMPLANT
GAUZE SPONGE 4X4 16PLY XRAY LF (GAUZE/BANDAGES/DRESSINGS) IMPLANT
GLOVE BIOGEL PI IND STRL 7.5 (GLOVE) IMPLANT
GLOVE BIOGEL PI INDICATOR 7.5 (GLOVE) ×2
GLOVE ECLIPSE 7.5 STRL STRAW (GLOVE) ×3 IMPLANT
GLOVE SURG SS PI 7.0 STRL IVOR (GLOVE) ×4 IMPLANT
GOWN STRL REUS W/ TWL LRG LVL3 (GOWN DISPOSABLE) ×2 IMPLANT
GOWN STRL REUS W/ TWL XL LVL3 (GOWN DISPOSABLE) IMPLANT
GOWN STRL REUS W/TWL LRG LVL3 (GOWN DISPOSABLE) ×6
GOWN STRL REUS W/TWL XL LVL3 (GOWN DISPOSABLE) ×3
HEMOSTAT SURGICEL .5X2 ABSORB (HEMOSTASIS) IMPLANT
NEEDLE 27GAX1X1/2 (NEEDLE) ×3 IMPLANT
NS IRRIG 1000ML POUR BTL (IV SOLUTION) ×2 IMPLANT
PACK BASIN DAY SURGERY FS (CUSTOM PROCEDURE TRAY) ×3 IMPLANT
PAD DRESSING TELFA 3X8 NADH (GAUZE/BANDAGES/DRESSINGS) IMPLANT
PATTIES SURGICAL .5 X3 (DISPOSABLE) ×3 IMPLANT
SHEET SILASTIC 8X6X.030 25-30 (MISCELLANEOUS) IMPLANT
SPONGE GAUZE 2X2 8PLY STER LF (GAUZE/BANDAGES/DRESSINGS) ×1
SPONGE GAUZE 2X2 8PLY STRL LF (GAUZE/BANDAGES/DRESSINGS) ×2 IMPLANT
STRIP CLOSURE SKIN 1/2X4 (GAUZE/BANDAGES/DRESSINGS) IMPLANT
SUT CHROMIC 4 0 P 3 18 (SUTURE) ×3 IMPLANT
SUT ETHILON 3 0 PS 1 (SUTURE) IMPLANT
SUT ETHILON 4 0 CL P 3 (SUTURE) IMPLANT
SUT ETHILON 6 0 P 1 (SUTURE) IMPLANT
SUT PLAIN 4 0 ~~LOC~~ 1 (SUTURE) ×3 IMPLANT
TOWEL OR 17X24 6PK STRL BLUE (TOWEL DISPOSABLE) ×3 IMPLANT
YANKAUER SUCT BULB TIP NO VENT (SUCTIONS) ×3 IMPLANT

## 2013-06-04 NOTE — Op Note (Signed)
OPERATIVE REPORT  DATE OF SURGERY: 06/04/2013  PATIENT:  Brandon Giles,  53 y.o. male  PRE-OPERATIVE DIAGNOSIS:  DEVATED NASAL SEPTUM TURBINATE HYPERTROPHY  POST-OPERATIVE DIAGNOSIS:  DEVATED NASAL SEPTUM TURBINATE HYPERTROPHY  PROCEDURE:  Procedure(s): BILATERNAL NASAL SEPTOPLASTY WITH TURBINATE REDUCTION  SURGEON:  Beckie Salts, MD  ASSISTANTS: none  ANESTHESIA:   General   EBL:  20 ml  DRAINS: none  LOCAL MEDICATIONS USED:  1% Xylocaine with epinephrine  SPECIMEN:  none  COUNTS:  Correct  PROCEDURE DETAILS: The patient was taken to the operating room and placed on the operating table in the supine position. Following induction of general endotracheal anesthesia, the nose was draped in a standard fashion. 1% Xylocaine with epinephrine was infiltrated into the septum, columella, and the inferior turbinates bilaterally. Afrin pledgets were placed bilaterally for several minutes. #1 nasal septoplasty. A left hemitransfixion incision was accomplished with a #15 scalpel and a mucoperichondrial flap was developed posteriorly down the left side. The bony cartilaginous junction was divided and a mucosal flap was developed down the right side as well. The upper part of the ethmoid plate was resected as it was deflected towards the right. The anterior vomer was resected as that was also causing spurring on the right side. A small piece of maxillary crest was removed using osteotome. These measures allowed the septum to lie straight down the midline. The mucosal incision was reapproximated with 4-0 chromic suture. The septal flaps were quilted with plain gut. #2 submucous resection inferior turbinates. The leading edge of the inferior turbinates were incised in a vertical fashion with a #15 scalpel. A Cottle elevator was used to elevate mucosa off bone in all directions and bony fragments were removed. The turbinate remnants were outfractured. Nasal cavities were suctioned of blood and  secretions and packed with rolled up Telfa gauze, and with bacitracin ointment. The pharynx was suctioned of blood and secretions. The patient was awakened, extubated and transferred to recovery in stable condition.   PATIENT DISPOSITION:  To PACU, stable

## 2013-06-04 NOTE — Anesthesia Postprocedure Evaluation (Signed)
Anesthesia Post Note  Patient: Brandon Giles  Procedure(s) Performed: Procedure(s) (LRB): BILATERNAL NASAL SEPTOPLASTY WITH TURBINATE REDUCTION (Bilateral)  Anesthesia type: general  Patient location: PACU  Post pain: Pain level controlled  Post assessment: Patient's Cardiovascular Status Stable  Last Vitals:  Filed Vitals:   06/04/13 1115  BP: 155/94  Pulse: 61  Temp:   Resp: 18    Post vital signs: Reviewed and stable  Level of consciousness: sedated  Complications: No apparent anesthesia complications

## 2013-06-04 NOTE — Anesthesia Preprocedure Evaluation (Signed)
Anesthesia Evaluation  Patient identified by MRN, date of birth, ID band Patient awake    Reviewed: Allergy & Precautions, H&P , NPO status , Patient's Chart, lab work & pertinent test results  Airway Mallampati: II TM Distance: >3 FB Neck ROM: full    Dental  (+) Teeth Intact and Dental Advidsory Given   Pulmonary neg pulmonary ROS,  breath sounds clear to auscultation        Cardiovascular hypertension, On Medications and On Home Beta Blockers Rhythm:regular Rate:Normal     Neuro/Psych negative neurological ROS  negative psych ROS   GI/Hepatic negative GI ROS, Neg liver ROS,   Endo/Other  negative endocrine ROS  Renal/GU negative Renal ROS     Musculoskeletal   Abdominal   Peds  Hematology   Anesthesia Other Findings   Reproductive/Obstetrics negative OB ROS                           Anesthesia Physical Anesthesia Plan  ASA: II  Anesthesia Plan: General ETT   Post-op Pain Management:    Induction:   Airway Management Planned:   Additional Equipment:   Intra-op Plan:   Post-operative Plan:   Informed Consent: I have reviewed the patients History and Physical, chart, labs and discussed the procedure including the risks, benefits and alternatives for the proposed anesthesia with the patient or authorized representative who has indicated his/her understanding and acceptance.   Dental Advisory Given  Plan Discussed with: Anesthesiologist, CRNA and Surgeon  Anesthesia Plan Comments:         Anesthesia Quick Evaluation

## 2013-06-04 NOTE — Transfer of Care (Signed)
Immediate Anesthesia Transfer of Care Note  Patient: Brandon Giles  Procedure(s) Performed: Procedure(s): BILATERNAL NASAL SEPTOPLASTY WITH TURBINATE REDUCTION (Bilateral)  Patient Location: PACU  Anesthesia Type:General  Level of Consciousness: awake and patient cooperative  Airway & Oxygen Therapy: Patient Spontanous Breathing, Patient connected to face mask and aerosol face mask  Post-op Assessment: Report given to PACU RN and Post -op Vital signs reviewed and stable  Post vital signs: Reviewed and stable  Complications: No apparent anesthesia complications

## 2013-06-04 NOTE — Interval H&P Note (Signed)
History and Physical Interval Note:  06/04/2013 9:26 AM  Brandon Giles  has presented today for surgery, with the diagnosis of [name]  The various methods of treatment have been discussed with the patient and family. After consideration of risks, benefits and other options for treatment, the patient has consented to  Procedure(s): BILATERNAL NASAL SEPTOPLASTY WITH TURBINATE REDUCTION (Bilateral) as a surgical intervention .  The patient's history has been reviewed, patient examined, no change in status, stable for surgery.  I have reviewed the patient's chart and labs.  Questions were answered to the patient's satisfaction.     [name]

## 2013-06-04 NOTE — Discharge Instructions (Signed)
Nasal Septal Reconstruction Nasal septal reconstruction or nasal septoplasty is a procedure to straighten the nasal septum. The nasal septum is a wall that separates the two nostrils and nasal passages. It is slightly off center in most people. If the septum is severely deviated, it may result in problems, such as difficulty in breathing through the nose. The bend in the septum may be present at birth or could be due to an injury. This procedure is done if you have any of the following problems.  Deviation of the nasal septum.  Repeated infection of the sinuses (air-filled cavities in the skull).  Pain due to the deviated septum.  Loss of smell due to the deviated septum.  A blood clot in the septum that interferes your breathing.  Frequent nosebleeds. If the outside of the nose is bent, it may have to be reconstructed by a surgery called rhinoplasty. Sometimes, this procedure may be combined with septoplasty. LET YOUR CAREGIVER KNOW ABOUT:   Allergies.  Previous problems with anesthetics.  History of bleeding or blood problems.  Any medicines that you are currently taking. RISKS AND COMPLICATIONS  You may have a hole in the septum.  You may have a collection of blood in the septum.  You may develop loss of sensation in the upper lip or teeth.  You may develop an infection.  You may have bleeding.  The front portion of your nose may become flatter than what it was before the procedure.  You may develop a reaction to the anesthetic used.  You may have a recurrence of the nasal obstruction. BEFORE THE PROCEDURE   Follow the instructions given by your caregiver.  Your caregiver may recommend x-ray and blood tests.  Your caregiver may advise you to stop smoking for at least 2 weeks before the procedure.  Your caregiver may advise you to stop taking aspirin and anti-inflammatory medications such as ibuprofen, 10 days before surgery, as these medicines can cause  bleeding. If the surgery is going to be under general anesthesia:  You may be advised to eat only a light meal, such as soup or salad the previous night.  You will be advised to avoid eating or drinking anything after midnight and also in the morning of the procedure. PROCEDURE  If the procedure is being done under general anesthesia, you may be put to sleep. You will not feel the pain. You will not be aware of the procedure. It can also be done under local anesthesia with sedation where the area of the surgery is numbed. The surgeon then makes a cut on the inner lining of the septum. If there is a blood clot, it is drained. The bone and cartilage of the septum are reshaped. The straightened septum is held in place using plastic sheets or splints. Your nose is then packed with gauze to control the bleeding. The procedure may take one to one and a half hours. It generally does not cause bruising or black eyes. AFTER THE PROCEDURE   You may be kept in the recovery room till the effect of the anesthesia wears off.  You may be then brought to your room in the hospital.  You may be asked to breathe through your mouth.  Your nose packing may need to stay in place for 3 to 4 days.  You may be given medicines for discomfort and nausea.  You may be given antibiotics.  You may be allowed to go home on the same day or have to  stay in the hospital for a few days. HOME CARE INSTRUCTIONS   Do not blow your nose.  Avoid doing heavy work and strenuous exercise for at least one week after the procedure.  Avoid pushing or moving your nose before it heals.  Avoid lifting weight and bending forwards.  Avoid using products that contain aspirin.  Keep your head raised while lying down.  Take the medicines as instructed by your caregiver.  Inform your caregiver if you have any problems after taking your medicine. SEEK MEDICAL CARE IF:   You have a new symptom.  You have doubts regarding the  procedure or its outcome. SEEK IMMEDIATE MEDICAL CARE IF:   You develop fever over 102 F (38.9 C).  You have severe difficulty in breathing.  Your nose continues to bleed even after you keep your head raised and apply ice to your forehead and nose for 10 to 15 minutes. Document Released: 07/23/2007 Document Revised: 07/08/2011 Document Reviewed: 07/23/2007 [facility] Patient Information 2014 [place], [place].    Post Anesthesia Home Care Instructions  Activity: Get plenty of rest for the remainder of the day. A responsible adult should stay with you for 24 hours following the procedure.  For the next 24 hours, DO NOT: -Drive a car -Paediatric nurse -Drink alcoholic beverages -Take any medication unless instructed by your physician -Make any legal decisions or sign important papers.  Meals: Start with liquid foods such as gelatin or soup. Progress to regular foods as tolerated. Avoid greasy, spicy, heavy foods. If nausea and/or vomiting occur, drink only clear liquids until the nausea and/or vomiting subsides. Call your physician if vomiting continues.  Special Instructions/Symptoms: Your throat may feel dry or sore from the anesthesia or the breathing tube placed in your throat during surgery. If this causes discomfort, gargle with warm salt water. The discomfort should disappear within 24 hours.  Post Anesthesia Home Care Instructions  Activity: Get plenty of rest for the remainder of the day. A responsible adult should stay with you for 24 hours following the procedure.  For the next 24 hours, DO NOT: -Drive a car -Paediatric nurse -Drink alcoholic beverages -Take any medication unless instructed by your physician -Make any legal decisions or sign important papers.  Meals: Start with liquid foods such as gelatin or soup. Progress to regular foods as tolerated. Avoid greasy, spicy, heavy foods. If nausea and/or vomiting occur, drink only clear liquids until the nausea  and/or vomiting subsides. Call your physician if vomiting continues.  Special Instructions/Symptoms: Your throat may feel dry or sore from the anesthesia or the breathing tube placed in your throat during surgery. If this causes discomfort, gargle with warm salt water. The discomfort should disappear within 24 hours.

## 2013-06-04 NOTE — Anesthesia Procedure Notes (Signed)
Procedure Name: Intubation Performed by: Tawni Millers Pre-anesthesia Checklist: Patient identified, Emergency Drugs available, Suction available and Patient being monitored Patient Re-evaluated:Patient Re-evaluated prior to inductionOxygen Delivery Method: Circle System Utilized Preoxygenation: Pre-oxygenation with 100% oxygen Intubation Type: IV induction Ventilation: Mask ventilation without difficulty Laryngoscope Size: Mac and 3 Tube type: Oral Tube size: 7.0 mm Number of attempts: 1 Airway Equipment and Method: stylet and oral airway Placement Confirmation: ETT inserted through vocal cords under direct vision,  positive ETCO2 and breath sounds checked- equal and bilateral Tube secured with: Tape Dental Injury: Teeth and Oropharynx as per pre-operative assessment

## 2013-06-07 ENCOUNTER — Encounter (HOSPITAL_BASED_OUTPATIENT_CLINIC_OR_DEPARTMENT_OTHER): Payer: Self-pay | Admitting: Otolaryngology

## 2013-06-11 ENCOUNTER — Encounter: Payer: 59 | Admitting: Internal Medicine

## 2013-07-15 ENCOUNTER — Other Ambulatory Visit: Payer: Self-pay | Admitting: Internal Medicine

## 2013-07-15 ENCOUNTER — Ambulatory Visit (AMBULATORY_SURGERY_CENTER): Payer: 59 | Admitting: Internal Medicine

## 2013-07-15 ENCOUNTER — Encounter: Payer: Self-pay | Admitting: Internal Medicine

## 2013-07-15 VITALS — BP 159/96 | HR 50 | Temp 97.9°F | Resp 12 | Ht 69.0 in | Wt 195.0 lb

## 2013-07-15 DIAGNOSIS — D126 Benign neoplasm of colon, unspecified: Secondary | ICD-10-CM

## 2013-07-15 DIAGNOSIS — Z1211 Encounter for screening for malignant neoplasm of colon: Secondary | ICD-10-CM

## 2013-07-15 HISTORY — PX: COLONOSCOPY: SHX174

## 2013-07-15 MED ORDER — SODIUM CHLORIDE 0.9 % IV SOLN: 500.0000 mL | INTRAVENOUS | Status: DC

## 2013-07-15 NOTE — Patient Instructions (Signed)
YOU HAD AN ENDOSCOPIC PROCEDURE TODAY AT [facility]: Refer to the procedure report that was given to you for any specific questions about what was found during the examination.  If the procedure report does not answer your questions, please call your gastroenterologist to clarify.  If you requested that your care partner not be given the details of your procedure findings, then the procedure report has been included in a sealed envelope for you to review at your convenience later.  YOU SHOULD EXPECT: Some feelings of bloating in the abdomen. Passage of more gas than usual.  Walking can help get rid of the air that was put into your GI tract during the procedure and reduce the bloating. If you had a lower endoscopy (such as a colonoscopy or flexible sigmoidoscopy) you may notice spotting of blood in your stool or on the toilet paper. If you underwent a bowel prep for your procedure, then you may not have a normal bowel movement for a few days.  DIET: Your first meal following the procedure should be a light meal and then it is ok to progress to your normal diet.  A half-sandwich or bowl of soup is an example of a good first meal.  Heavy or fried foods are harder to digest and may make you feel nauseous or bloated.  Likewise meals heavy in dairy and vegetables can cause extra gas to form and this can also increase the bloating.  Drink plenty of fluids but you should avoid alcoholic beverages for 24 hours.  ACTIVITY: Your care partner should take you home directly after the procedure.  You should plan to take it easy, moving slowly for the rest of the day.  You can resume normal activity the day after the procedure however you should NOT DRIVE or use heavy machinery for 24 hours (because of the sedation medicines used during the test).    SYMPTOMS TO REPORT IMMEDIATELY: A gastroenterologist can be reached at any hour.  During normal business hours, 8:30 AM to 5:00 PM Monday through Friday,  call [phone].  After hours and on weekends, please call the GI answering service at [phone] who will take a message and have the physician on call contact you.   Following lower endoscopy (colonoscopy or flexible sigmoidoscopy):  Excessive amounts of blood in the stool  Significant tenderness or worsening of abdominal pains  Swelling of the abdomen that is new, acute   FOLLOW UP: If any biopsies were taken you will be contacted by phone or by letter within the next 1-3 weeks.  Call your gastroenterologist if you have not heard about the biopsies in 3 weeks.  Our staff will call the home number listed on your records the next business day following your procedure to check on you and address any questions or concerns that you may have at that time regarding the information given to you following your procedure. This is a courtesy call and so if there is no answer at the home number and we have not heard from you through the emergency physician on call, we will assume that you have returned to your regular daily activities without incident.  SIGNATURES/CONFIDENTIALITY: You and/or your care partner have signed paperwork which will be entered into your electronic medical record.  These signatures attest to the fact that that the information above on your After Visit Summary has been reviewed and is understood.  Full responsibility of the confidentiality of this discharge information lies with you  and/or your care-partner.  Polyp and diverticulosis information given.  High fiber diet infomration given.  Dr. Olevia Perches will advise you about recall colonoscopy after reviewing pathology results.

## 2013-07-15 NOTE — Progress Notes (Signed)
Called to room to assist during endoscopic procedure.  Patient ID and intended procedure confirmed with present staff. Received instructions for my participation in the procedure from the performing physician.  

## 2013-07-15 NOTE — Op Note (Signed)
[place]  [address] [place], [id number]   COLONOSCOPY PROCEDURE REPORT  PATIENT: Brandon Giles, Brandon Giles  MR#: [id number] BIRTHDATE: 16-Dec-1960 , 53  yrs. old GENDER: Male ENDOSCOPIST: Lafayette Dragon, MD REFERRED LT:JQZES Gerarda Fraction, M.D. PROCEDURE DATE:  07/15/2013 PROCEDURE:   Colonoscopy with snare polypectomy First Screening Colonoscopy - Avg.  risk and is 50 yrs.  old or older Yes.  Prior Negative Screening - Now for repeat screening. N/A  History of Adenoma - Now for follow-up colonoscopy & has been > or = to 3 yrs.  N/A  Polyps Removed Today? Yes. ASA CLASS:   Class I INDICATIONS:Average risk patient for colon cancer. MEDICATIONS: MAC sedation, administered by CRNA and Propofol (Diprivan) 230 mg IV  DESCRIPTION OF PROCEDURE:   After the risks benefits and alternatives of the procedure were thoroughly explained, informed consent was obtained.  A digital rectal exam revealed no abnormalities of the rectum.   The LB [id number]  endoscope was introduced through the anus and advanced to the cecum, which was identified by both the appendix and ileocecal valve. No adverse events experienced.   The quality of the prep was good, using MoviPrep  The instrument was then slowly withdrawn as the colon was fully examined.      COLON FINDINGS: A smooth semi-pedunculated polyp ranging between 3-41mm in size was found in the descending colon.  A polypectomy was performed with a cold snare.  The resection was complete and the polyp tissue was completely retrieved.   Mild diverticulosis was noted in the sigmoid colon.  Retroflexed views revealed no abnormalities. The time to cecum=4 minutes 44 seconds.  Withdrawal time=9 minutes 07 seconds.  The scope was withdrawn and the procedure completed. COMPLICATIONS: There were no complications.  ENDOSCOPIC IMPRESSION: 1.   Semi-pedunculated polyp ranging between 3-29mm in size was found in the descending colon at 70 cm;  polypectomy was performed with a cold snare 2.   Mild diverticulosis was noted in the sigmoid colon  RECOMMENDATIONS: 1.  Await pathology results 2.  High fiber diet 3. recall colonoscopy pending path report  eSigned:  Lafayette Dragon, MD 07/15/2013 2:12 PM   cc:   PATIENT NAME:  Brandon Giles, Brandon Giles MR#: [id number]

## 2013-07-15 NOTE — Progress Notes (Signed)
Report to pacu rn, vss, bbs=clear 

## 2013-07-16 ENCOUNTER — Telehealth: Payer: Self-pay

## 2013-07-16 NOTE — Telephone Encounter (Signed)
  Follow up Call-  Call back number 07/15/2013  Post procedure Call Back phone  # [phone]  Permission to leave phone message Yes     Patient questions:  Do you have a fever, pain , or abdominal swelling? no Pain Score  0 *  Have you tolerated food without any problems? yes  Have you been able to return to your normal activities? yes  Do you have any questions about your discharge instructions: Diet   no Medications  no Follow up visit  no  Do you have questions or concerns about your Care? no  Actions: * If pain score is 4 or above: No action needed, pain <4.

## 2013-07-20 ENCOUNTER — Encounter: Payer: Self-pay | Admitting: Internal Medicine

## 2014-08-09 ENCOUNTER — Other Ambulatory Visit: Payer: Self-pay | Admitting: Dermatology

## 2015-08-21 DIAGNOSIS — H833X3 Noise effects on inner ear, bilateral: Secondary | ICD-10-CM | POA: Insufficient documentation

## 2016-04-30 DIAGNOSIS — J329 Chronic sinusitis, unspecified: Secondary | ICD-10-CM | POA: Diagnosis not present

## 2016-08-21 DIAGNOSIS — L578 Other skin changes due to chronic exposure to nonionizing radiation: Secondary | ICD-10-CM | POA: Diagnosis not present

## 2016-09-05 DIAGNOSIS — L309 Dermatitis, unspecified: Secondary | ICD-10-CM | POA: Diagnosis not present

## 2016-10-29 DIAGNOSIS — L309 Dermatitis, unspecified: Secondary | ICD-10-CM | POA: Diagnosis not present

## 2016-12-06 DIAGNOSIS — J069 Acute upper respiratory infection, unspecified: Secondary | ICD-10-CM | POA: Diagnosis not present

## 2016-12-06 DIAGNOSIS — S90562A Insect bite (nonvenomous), left ankle, initial encounter: Secondary | ICD-10-CM | POA: Diagnosis not present

## 2017-01-13 DIAGNOSIS — K219 Gastro-esophageal reflux disease without esophagitis: Secondary | ICD-10-CM | POA: Diagnosis not present

## 2017-01-13 DIAGNOSIS — D485 Neoplasm of uncertain behavior of skin: Secondary | ICD-10-CM | POA: Diagnosis not present

## 2017-01-13 DIAGNOSIS — E291 Testicular hypofunction: Secondary | ICD-10-CM | POA: Diagnosis not present

## 2017-01-14 DIAGNOSIS — R5383 Other fatigue: Secondary | ICD-10-CM | POA: Diagnosis not present

## 2017-01-14 DIAGNOSIS — E291 Testicular hypofunction: Secondary | ICD-10-CM | POA: Diagnosis not present

## 2017-01-14 DIAGNOSIS — Z1389 Encounter for screening for other disorder: Secondary | ICD-10-CM | POA: Diagnosis not present

## 2017-01-15 DIAGNOSIS — L570 Actinic keratosis: Secondary | ICD-10-CM | POA: Diagnosis not present

## 2017-01-15 DIAGNOSIS — D2239 Melanocytic nevi of other parts of face: Secondary | ICD-10-CM | POA: Diagnosis not present

## 2017-01-15 DIAGNOSIS — D485 Neoplasm of uncertain behavior of skin: Secondary | ICD-10-CM | POA: Diagnosis not present

## 2017-01-15 DIAGNOSIS — L821 Other seborrheic keratosis: Secondary | ICD-10-CM | POA: Diagnosis not present

## 2017-01-15 DIAGNOSIS — L814 Other melanin hyperpigmentation: Secondary | ICD-10-CM | POA: Diagnosis not present

## 2017-02-20 DIAGNOSIS — E039 Hypothyroidism, unspecified: Secondary | ICD-10-CM | POA: Diagnosis not present

## 2017-04-04 DIAGNOSIS — E039 Hypothyroidism, unspecified: Secondary | ICD-10-CM | POA: Diagnosis not present

## 2017-05-13 DIAGNOSIS — N400 Enlarged prostate without lower urinary tract symptoms: Secondary | ICD-10-CM | POA: Diagnosis not present

## 2017-05-13 DIAGNOSIS — Z0001 Encounter for general adult medical examination with abnormal findings: Secondary | ICD-10-CM | POA: Diagnosis not present

## 2017-05-13 DIAGNOSIS — E063 Autoimmune thyroiditis: Secondary | ICD-10-CM | POA: Diagnosis not present

## 2017-05-16 DIAGNOSIS — Z1389 Encounter for screening for other disorder: Secondary | ICD-10-CM | POA: Diagnosis not present

## 2017-07-11 DIAGNOSIS — M25522 Pain in left elbow: Secondary | ICD-10-CM | POA: Insufficient documentation

## 2017-07-11 DIAGNOSIS — M25521 Pain in right elbow: Secondary | ICD-10-CM | POA: Insufficient documentation

## 2017-07-11 DIAGNOSIS — G5621 Lesion of ulnar nerve, right upper limb: Secondary | ICD-10-CM | POA: Diagnosis not present

## 2017-07-11 DIAGNOSIS — M7712 Lateral epicondylitis, left elbow: Secondary | ICD-10-CM | POA: Diagnosis not present

## 2017-07-11 DIAGNOSIS — G5620 Lesion of ulnar nerve, unspecified upper limb: Secondary | ICD-10-CM | POA: Insufficient documentation

## 2017-08-05 DIAGNOSIS — L820 Inflamed seborrheic keratosis: Secondary | ICD-10-CM | POA: Diagnosis not present

## 2017-08-05 DIAGNOSIS — D485 Neoplasm of uncertain behavior of skin: Secondary | ICD-10-CM | POA: Diagnosis not present

## 2017-08-05 DIAGNOSIS — D225 Melanocytic nevi of trunk: Secondary | ICD-10-CM | POA: Diagnosis not present

## 2017-08-05 DIAGNOSIS — L905 Scar conditions and fibrosis of skin: Secondary | ICD-10-CM | POA: Diagnosis not present

## 2017-08-08 DIAGNOSIS — G5621 Lesion of ulnar nerve, right upper limb: Secondary | ICD-10-CM | POA: Diagnosis not present

## 2017-08-08 DIAGNOSIS — M7712 Lateral epicondylitis, left elbow: Secondary | ICD-10-CM | POA: Insufficient documentation

## 2017-08-11 DIAGNOSIS — R7301 Impaired fasting glucose: Secondary | ICD-10-CM | POA: Diagnosis not present

## 2017-08-27 DIAGNOSIS — R400 Somnolence: Secondary | ICD-10-CM | POA: Diagnosis not present

## 2017-08-27 DIAGNOSIS — E291 Testicular hypofunction: Secondary | ICD-10-CM | POA: Diagnosis not present

## 2017-08-27 DIAGNOSIS — N400 Enlarged prostate without lower urinary tract symptoms: Secondary | ICD-10-CM | POA: Diagnosis not present

## 2017-09-10 DIAGNOSIS — R400 Somnolence: Secondary | ICD-10-CM | POA: Diagnosis not present

## 2017-09-11 DIAGNOSIS — G4730 Sleep apnea, unspecified: Secondary | ICD-10-CM | POA: Diagnosis not present

## 2017-09-12 DIAGNOSIS — M7712 Lateral epicondylitis, left elbow: Secondary | ICD-10-CM | POA: Diagnosis not present

## 2017-09-12 DIAGNOSIS — G5621 Lesion of ulnar nerve, right upper limb: Secondary | ICD-10-CM | POA: Diagnosis not present

## 2017-09-23 DIAGNOSIS — M7712 Lateral epicondylitis, left elbow: Secondary | ICD-10-CM | POA: Diagnosis not present

## 2017-09-25 DIAGNOSIS — G4733 Obstructive sleep apnea (adult) (pediatric): Secondary | ICD-10-CM | POA: Diagnosis not present

## 2017-09-26 DIAGNOSIS — G4733 Obstructive sleep apnea (adult) (pediatric): Secondary | ICD-10-CM | POA: Diagnosis not present

## 2017-09-29 DIAGNOSIS — M7712 Lateral epicondylitis, left elbow: Secondary | ICD-10-CM | POA: Diagnosis not present

## 2017-10-02 DIAGNOSIS — G4733 Obstructive sleep apnea (adult) (pediatric): Secondary | ICD-10-CM | POA: Diagnosis not present

## 2017-11-01 DIAGNOSIS — G4733 Obstructive sleep apnea (adult) (pediatric): Secondary | ICD-10-CM | POA: Diagnosis not present

## 2017-11-18 DIAGNOSIS — G4733 Obstructive sleep apnea (adult) (pediatric): Secondary | ICD-10-CM | POA: Diagnosis not present

## 2017-11-18 DIAGNOSIS — E785 Hyperlipidemia, unspecified: Secondary | ICD-10-CM | POA: Diagnosis not present

## 2017-11-18 DIAGNOSIS — I10 Essential (primary) hypertension: Secondary | ICD-10-CM | POA: Diagnosis not present

## 2017-12-02 DIAGNOSIS — G4733 Obstructive sleep apnea (adult) (pediatric): Secondary | ICD-10-CM | POA: Diagnosis not present

## 2017-12-25 ENCOUNTER — Other Ambulatory Visit (HOSPITAL_BASED_OUTPATIENT_CLINIC_OR_DEPARTMENT_OTHER): Payer: Self-pay

## 2017-12-25 DIAGNOSIS — G4733 Obstructive sleep apnea (adult) (pediatric): Secondary | ICD-10-CM

## 2018-01-02 DIAGNOSIS — G4733 Obstructive sleep apnea (adult) (pediatric): Secondary | ICD-10-CM | POA: Diagnosis not present

## 2018-02-01 DIAGNOSIS — G4733 Obstructive sleep apnea (adult) (pediatric): Secondary | ICD-10-CM | POA: Diagnosis not present

## 2018-02-04 DIAGNOSIS — D1801 Hemangioma of skin and subcutaneous tissue: Secondary | ICD-10-CM | POA: Diagnosis not present

## 2018-02-04 DIAGNOSIS — L821 Other seborrheic keratosis: Secondary | ICD-10-CM | POA: Diagnosis not present

## 2018-02-04 DIAGNOSIS — L570 Actinic keratosis: Secondary | ICD-10-CM | POA: Diagnosis not present

## 2018-02-04 DIAGNOSIS — D225 Melanocytic nevi of trunk: Secondary | ICD-10-CM | POA: Diagnosis not present

## 2018-02-06 ENCOUNTER — Ambulatory Visit: Payer: 59 | Attending: Neurology | Admitting: Neurology

## 2018-02-06 DIAGNOSIS — G4733 Obstructive sleep apnea (adult) (pediatric): Secondary | ICD-10-CM | POA: Insufficient documentation

## 2018-02-11 NOTE — Procedures (Signed)
[place] A. Merlene Laughter, MD     [email or web address]             NOCTURNAL POLYSOMNOGRAPHY   LOCATION: [facility]   Patient Name: Brandon Giles, Brandon Giles Date: 02/06/2018 Gender: Male D.O.B: Jun 04, 1960 Age (years): 81 Referring Provider: Barton Fanny NP Height (inches): 72 Interpreting Physician: Phillips Odor MD, ABSM Weight (lbs): 208 RPSGT: Peak, Robert BMI: 28 MRN: [id number] Neck Size: 17.50 CLINICAL INFORMATION The patient is referred for a CPAP titration to treat sleep apnea.  Date of NPSG, Split Night or HST:  SLEEP STUDY TECHNIQUE As per the AASM Manual for the Scoring of Sleep and Associated Events v2.3 (April 2016) with a hypopnea requiring 4% desaturations.  The channels recorded and monitored were frontal, central and occipital EEG, electrooculogram (EOG), submentalis EMG (chin), nasal and oral airflow, thoracic and abdominal wall motion, anterior tibialis EMG, snore microphone, electrocardiogram, and pulse oximetry. Continuous positive airway pressure (CPAP) was initiated at the beginning of the study and titrated to treat sleep-disordered breathing.  MEDICATIONS Medications self-administered by patient taken the night of the study : N/A  Current Outpatient Medications:  .  aspirin 81 MG tablet, Take 81 mg by mouth daily., Disp: , Rfl:  .  fluticasone (FLONASE) 50 MCG/ACT nasal spray, Place 2 sprays into the nose daily., Disp: , Rfl:  .  Krill Oil 1000 MG CAPS, Take 1 capsule by mouth daily., Disp: , Rfl:  .  loratadine (CLARITIN) 10 MG tablet, Take 10 mg by mouth every evening., Disp: , Rfl:  .  metoprolol tartrate (LOPRESSOR) 25 MG tablet, Take 25 mg by mouth every morning., Disp: , Rfl:  .  Multiple Vitamin (MULTIVITAMIN) tablet, Take 1 tablet by mouth daily., Disp: , Rfl:  .  naproxen sodium (ANAPROX) 220 MG tablet, Take 220 mg by mouth as needed., Disp: , Rfl:  .  pravastatin (PRAVACHOL) 20 MG tablet, , Disp: , Rfl:  .  promethazine  (PHENERGAN) 25 MG suppository, Place 1 suppository (25 mg total) rectally every 6 (six) hours as needed for nausea or vomiting., Disp: 12 each, Rfl: 0 .  pseudoephedrine (SUDAFED) 30 MG tablet, Take 30 mg by mouth every 4 (four) hours as needed for congestion., Disp: , Rfl:  .  telmisartan (MICARDIS) 40 MG tablet, Take 40 mg by mouth every evening., Disp: , Rfl:  .  traMADol (ULTRAM) 50 MG tablet, Take 1 tablet (50 mg total) by mouth every 6 (six) hours as needed., Disp: 30 tablet, Rfl: 0   TECHNICIAN COMMENTS Comments added by technician: PATIENT COMES TO SLEEP CENTER FOR CPAP RETITRATION. ALL STAGES OF SLEEP WERE OBSERVED. PATIENT RESPONDED WELL TO CPAP PRESSURE WHICH ELIMINATED ALL APNEA AND SNORING. SOME TRANSITIONAL EVENTS WERE NOTED WHICH MAY HAVE BEEN CAUSING CURRENT AUTOSET TO RAISE PRESSURE. ONCE PATIENT TRANSITIONED TO SLEEP FROM WAKE EVENTS WERE NOT PRESENT DURING SLEEP. NO LEG MOVEMENTS WERE NOTED. NO BUXISM WAS NOTED. ECG SHOWED NORMAL SINUS RHYTHM. Comments added by scorer: N/A RESPIRATORY PARAMETERS Optimal PAP Pressure (cm): 10 AHI at Optimal Pressure (/hr): 6.4 Overall Minimal O2 (%): 87.0 Supine % at Optimal Pressure (%): 0 Minimal O2 at Optimal Pressure (%): 89.0   SLEEP ARCHITECTURE The study was initiated at 11:08:26 PM and ended at 5:43:45 AM.  Sleep onset time was 3.1 minutes and the sleep efficiency was 88.6%%. The total sleep time was 350.2 minutes.  The patient spent 3.3%% of the night in stage N1 sleep, 58.3%% in stage N2 sleep, 11.7%% in stage N3 and 26.8% in  REM.Stage REM latency was 68.0 minutes  Wake after sleep onset was 42.0. Alpha intrusion was absent. Supine sleep was 21.56%.  CARDIAC DATA The 2 lead EKG demonstrated sinus rhythm. The mean heart rate was 49.0 beats per minute. Other EKG findings include: None.  LEG MOVEMENT DATA The total Periodic Limb Movements of Sleep (PLMS) were 0. The PLMS index was 0.0. A PLMS index of <15 is considered normal in  adults.  IMPRESSIONS The optimal CPAP is 10 cm of water.  Delano Metz, MD Diplomate, American Board of Sleep Medicine.  ELECTRONICALLY SIGNED ON:  02/11/2018, 10:06 AM [facility] PH: [phone]   FX: [phone] [place]

## 2018-03-04 DIAGNOSIS — G4733 Obstructive sleep apnea (adult) (pediatric): Secondary | ICD-10-CM | POA: Diagnosis not present

## 2018-03-09 DIAGNOSIS — G4733 Obstructive sleep apnea (adult) (pediatric): Secondary | ICD-10-CM | POA: Diagnosis not present

## 2018-03-09 DIAGNOSIS — E785 Hyperlipidemia, unspecified: Secondary | ICD-10-CM | POA: Diagnosis not present

## 2018-03-09 DIAGNOSIS — I10 Essential (primary) hypertension: Secondary | ICD-10-CM | POA: Diagnosis not present

## 2018-04-03 DIAGNOSIS — G4733 Obstructive sleep apnea (adult) (pediatric): Secondary | ICD-10-CM | POA: Diagnosis not present

## 2018-04-09 DIAGNOSIS — M7712 Lateral epicondylitis, left elbow: Secondary | ICD-10-CM | POA: Diagnosis not present

## 2018-04-16 DIAGNOSIS — J069 Acute upper respiratory infection, unspecified: Secondary | ICD-10-CM | POA: Diagnosis not present

## 2018-04-16 DIAGNOSIS — Z6831 Body mass index (BMI) 31.0-31.9, adult: Secondary | ICD-10-CM | POA: Diagnosis not present

## 2018-04-16 DIAGNOSIS — E6609 Other obesity due to excess calories: Secondary | ICD-10-CM | POA: Diagnosis not present

## 2018-05-04 DIAGNOSIS — G4733 Obstructive sleep apnea (adult) (pediatric): Secondary | ICD-10-CM | POA: Diagnosis not present

## 2018-05-26 DIAGNOSIS — L309 Dermatitis, unspecified: Secondary | ICD-10-CM | POA: Diagnosis not present

## 2018-05-26 DIAGNOSIS — E6609 Other obesity due to excess calories: Secondary | ICD-10-CM | POA: Diagnosis not present

## 2018-05-26 DIAGNOSIS — J019 Acute sinusitis, unspecified: Secondary | ICD-10-CM | POA: Diagnosis not present

## 2018-06-03 DIAGNOSIS — R112 Nausea with vomiting, unspecified: Secondary | ICD-10-CM | POA: Diagnosis not present

## 2018-06-04 DIAGNOSIS — G4733 Obstructive sleep apnea (adult) (pediatric): Secondary | ICD-10-CM | POA: Diagnosis not present

## 2018-06-19 ENCOUNTER — Encounter: Payer: Self-pay | Admitting: Gastroenterology

## 2018-06-25 DIAGNOSIS — M7712 Lateral epicondylitis, left elbow: Secondary | ICD-10-CM | POA: Diagnosis not present

## 2018-07-03 DIAGNOSIS — I10 Essential (primary) hypertension: Secondary | ICD-10-CM | POA: Diagnosis not present

## 2018-07-03 DIAGNOSIS — E6609 Other obesity due to excess calories: Secondary | ICD-10-CM | POA: Diagnosis not present

## 2018-07-03 DIAGNOSIS — K219 Gastro-esophageal reflux disease without esophagitis: Secondary | ICD-10-CM | POA: Diagnosis not present

## 2018-07-08 DIAGNOSIS — I10 Essential (primary) hypertension: Secondary | ICD-10-CM | POA: Diagnosis not present

## 2018-07-08 DIAGNOSIS — Z79899 Other long term (current) drug therapy: Secondary | ICD-10-CM | POA: Diagnosis not present

## 2018-07-10 ENCOUNTER — Other Ambulatory Visit (HOSPITAL_COMMUNITY): Payer: Self-pay | Admitting: Internal Medicine

## 2018-07-10 ENCOUNTER — Other Ambulatory Visit: Payer: Self-pay | Admitting: Internal Medicine

## 2018-07-10 DIAGNOSIS — Z0001 Encounter for general adult medical examination with abnormal findings: Secondary | ICD-10-CM | POA: Diagnosis not present

## 2018-07-10 DIAGNOSIS — E7849 Other hyperlipidemia: Secondary | ICD-10-CM | POA: Diagnosis not present

## 2018-07-10 DIAGNOSIS — Z1389 Encounter for screening for other disorder: Secondary | ICD-10-CM

## 2018-07-10 DIAGNOSIS — E063 Autoimmune thyroiditis: Secondary | ICD-10-CM | POA: Diagnosis not present

## 2018-07-15 ENCOUNTER — Ambulatory Visit (HOSPITAL_COMMUNITY)
Admission: RE | Admit: 2018-07-15 | Discharge: 2018-07-15 | Disposition: A | Discharge status: Home / Self Care | Payer: 59 | Source: Ambulatory Visit | Attending: Internal Medicine | Admitting: Internal Medicine

## 2018-07-15 ENCOUNTER — Other Ambulatory Visit: Payer: Self-pay

## 2018-07-15 DIAGNOSIS — E785 Hyperlipidemia, unspecified: Secondary | ICD-10-CM | POA: Diagnosis not present

## 2018-07-15 DIAGNOSIS — Z1389 Encounter for screening for other disorder: Secondary | ICD-10-CM

## 2018-07-20 ENCOUNTER — Encounter: Payer: Self-pay | Admitting: Gastroenterology

## 2018-07-21 ENCOUNTER — Encounter: Payer: Self-pay | Admitting: Gastroenterology

## 2018-08-04 ENCOUNTER — Telehealth: Payer: Self-pay | Admitting: *Deleted

## 2018-08-04 NOTE — Telephone Encounter (Signed)
Pt verbalized consent for telehealth appt via webex. Verified pt meds/pharmacy/allergies. Pt will have BP/HR/weight available prior to appt

## 2018-08-05 ENCOUNTER — Encounter: Payer: Self-pay | Admitting: *Deleted

## 2018-08-05 ENCOUNTER — Other Ambulatory Visit: Payer: Self-pay

## 2018-08-05 ENCOUNTER — Encounter: Payer: Self-pay | Admitting: Cardiology

## 2018-08-05 ENCOUNTER — Telehealth (INDEPENDENT_AMBULATORY_CARE_PROVIDER_SITE_OTHER): Payer: 59 | Admitting: Cardiology

## 2018-08-05 VITALS — BP 140/89 | HR 70 | Ht 68.0 in | Wt 200.0 lb

## 2018-08-05 DIAGNOSIS — Z79899 Other long term (current) drug therapy: Secondary | ICD-10-CM

## 2018-08-05 DIAGNOSIS — I10 Essential (primary) hypertension: Secondary | ICD-10-CM

## 2018-08-05 DIAGNOSIS — E782 Mixed hyperlipidemia: Secondary | ICD-10-CM

## 2018-08-05 DIAGNOSIS — Z9189 Other specified personal risk factors, not elsewhere classified: Secondary | ICD-10-CM

## 2018-08-05 DIAGNOSIS — E785 Hyperlipidemia, unspecified: Secondary | ICD-10-CM

## 2018-08-05 NOTE — Progress Notes (Signed)
Virtual Visit via Video Note   This visit type was conducted due to national recommendations for restrictions regarding the COVID-19 Pandemic (e.g. social distancing) in an effort to limit this patient's exposure and mitigate transmission in our community.  Due to his co-morbid illnesses, this patient is at least at moderate risk for complications without adequate follow up.  This format is felt to be most appropriate for this patient at this time.  All issues noted in this document were discussed and addressed.  A limited physical exam was performed with this format.  Please refer to the patient's chart for his consent to telehealth for [facility].   Evaluation Performed:  Follow-up visit  Date:  08/05/2018   ID:  Brandon Giles, DOB 10/24/1960, MRN [id number]  Patient Location: Home  Provider Location: Office  PCP:  Redmond School, MD  Cardiologist:  Dr Carlyle Dolly MD Electrophysiologist:  None   Chief Complaint:    History of Present Illness:    Brandon Giles is a 58 y.o. male who presents via audio/video conferencing for a telehealth visit today.    Seen as new patient, referred for CAD risk factor assessment.    1. CAD risk assessment CAD risk factors: HTN, hyperlipidemia, mother with history of CVAs started later 46s, father history of CVA 25s.   - no recent chest pain. No signficiant SOB/DOE - sedentary lifestyle. Limited exertion due to chronic knee pains.    2. HTN - takes lopressor 25mg  in AM. Had been on telmisartan - was having low bp's. Stopped telmisartan. Takes lopressor 25mg  in AM.   - low bp's with exertion. Previouslty was as low 100s/60s. Stays well hydrated.   3. Hyperlipidemia - 06/2018 TC 179 TG 123 HDL 35 LDL 119 - he is on pravastatin 40mg  daily.    SH: daughter is expecting. Mother with recent CVA.   The patient does not have symptoms concerning for COVID-19 infection (fever, chills, cough, or new shortness of breath).     Past Medical History:  Diagnosis Date  . Allergy    seasonal  . HOH (hard of hearing)    left ear  . Hypertension   . PONV (postoperative nausea and vomiting)    after hernia repair  . Shortness of breath    with exercise-states 'out of shape'   Past Surgical History:  Procedure Laterality Date  . GANGLION CYST EXCISION  12/25/2011   Procedure: REMOVAL GANGLION OF WRIST;  Surgeon: Magnus Sinning, MD;  Location: [place];  Service: Orthopedics;  Laterality: Left;  excision of ganglion cyst of left wrist   . HERNIA REPAIR  mid '90's   bilateral ing  . KNEE ARTHROSCOPY  '99   left   . NASAL SEPTOPLASTY W/ TURBINOPLASTY Bilateral 06/04/2013   Procedure: BILATERNAL NASAL SEPTOPLASTY WITH TURBINATE REDUCTION;  Surgeon: Izora Gala, MD;  Location: [place];  Service: ENT;  Laterality: Bilateral;  . SEPTOPLASTY    . TONSILLECTOMY       No outpatient medications have been marked as taking for the 08/05/18 encounter (Appointment) with Arnoldo Lenis, MD.     Allergies:   Hydrocodone-acetaminophen   Social History   Tobacco Use  . Smoking status: Never Smoker  . Smokeless tobacco: Never Used  Substance Use Topics  . Alcohol use: No  . Drug use: No     Family Hx: The patient's family history includes Diabetes in his father; Heart disease in his mother; Lung cancer  in his mother; Stroke in his mother. There is no history of Colon cancer, Esophageal cancer, Rectal cancer, or Stomach cancer.  ROS:   Please see the history of present illness.    All other systems reviewed and are negative.   Prior CV studies:   The following studies were reviewed today:  06/2018 carotid US IMPRESSION: Less than 50% stenosis in the right and left internal carotid arteries  01/2018 sleep study The optimal CPAP is 10 cm of water.  Labs/Other Tests and Data Reviewed:    EKG:  na  Recent Labs: No results found for requested labs within last 8760 hours.    Recent Lipid Panel No results found for: CHOL, TRIG, HDL, CHOLHDL, LDLCALC, LDLDIRECT  Wt Readings from Last 3 Encounters:  07/15/13 195 lb (88.5 kg)  06/04/13 193 lb 2 oz (87.6 kg)  05/28/13 195 lb 9.6 oz (88.7 kg)     Objective:    Vital Signs:  There were no vitals taken for this visit.  Technical difficulties with video, unable to see patient.   ASSESSMENT & PLAN:    1. CAD risk factor assessment - no current cardiopulmonary symptoms, would not plan for any form of stress testing. Focus would be risk factor modification. No significant plaque on recent carotid US.  - calculated 10 year ASCVD risk is 8.8% - he is appropriately on statin.  - 2019 ACC guidelines no longer recommend universal use of ASA for primary prevention, we have discontinued.   2. HTN - at goal by reported home trends, only on Toprol 25mg  in AM, can continue - prior low bp's with exertion have improved since stopping telmisartan, continue to monitor.    3. Hyperlipidemia - reasonable lipid control, continue statin.     COVID-19 Education: The signs and symptoms of COVID-19 were discussed with the patient and how to seek care for testing (follow up with PCP or arrange E-visit).  The importance of social distancing was discussed today.  Time:   Today, I have spent 25 minutes with the patient with telehealth technology discussing the above problems.     Medication Adjustments/Labs and Tests Ordered: Current medicines are reviewed at length with the patient today.  Concerns regarding medicines are outlined above.  Tests Ordered: No orders of the defined types were placed in this encounter.  Medication Changes: No orders of the defined types were placed in this encounter.   Disposition:  Follow up 1 year  Signed, Carlyle Dolly, MD  08/05/2018 8:16 AM    [place]

## 2018-08-05 NOTE — Patient Instructions (Signed)
Your physician wants you to follow-up in: [place] will receive a reminder letter in the mail two months in advance. If you don't receive a letter, please call our office to schedule the follow-up appointment.  Your physician has recommended you make the following change in your medication:    STOP ASPIRIN   Thank you for choosing [facility]!!

## 2018-08-06 ENCOUNTER — Telehealth: Payer: Self-pay

## 2018-08-06 NOTE — Telephone Encounter (Signed)
Requested EKG from Dr.Fusco's office, fax response is there is no EKG in patient chart

## 2018-10-09 ENCOUNTER — Other Ambulatory Visit (HOSPITAL_COMMUNITY)
Admission: RE | Admit: 2018-10-09 | Discharge: 2018-10-09 | Disposition: A | Discharge status: Home / Self Care | Payer: 59 | Source: Other Acute Inpatient Hospital | Attending: Urology | Admitting: Urology

## 2018-10-09 ENCOUNTER — Ambulatory Visit (INDEPENDENT_AMBULATORY_CARE_PROVIDER_SITE_OTHER): Payer: 59 | Admitting: Urology

## 2018-10-09 DIAGNOSIS — N401 Enlarged prostate with lower urinary tract symptoms: Secondary | ICD-10-CM

## 2018-10-09 DIAGNOSIS — R350 Frequency of micturition: Secondary | ICD-10-CM

## 2018-10-09 DIAGNOSIS — R800 Isolated proteinuria: Secondary | ICD-10-CM | POA: Diagnosis present

## 2018-10-09 DIAGNOSIS — R972 Elevated prostate specific antigen [PSA]: Secondary | ICD-10-CM

## 2018-10-09 LAB — URINALYSIS, COMPLETE (UACMP) WITH MICROSCOPIC
Bacteria, UA: NONE SEEN
Glucose, UA: NEGATIVE mg/dL
Hgb urine dipstick: NEGATIVE
Ketones, ur: NEGATIVE mg/dL
Leukocytes,Ua: NEGATIVE
Nitrite: NEGATIVE
Protein, ur: NEGATIVE mg/dL
Specific Gravity, Urine: 1.028 (ref 1.005–1.030)
pH: 5 (ref 5.0–8.0)

## 2018-12-01 ENCOUNTER — Other Ambulatory Visit: Payer: Self-pay | Admitting: Urology

## 2018-12-01 ENCOUNTER — Other Ambulatory Visit (HOSPITAL_COMMUNITY): Payer: Self-pay | Admitting: Urology

## 2018-12-01 DIAGNOSIS — R972 Elevated prostate specific antigen [PSA]: Secondary | ICD-10-CM

## 2019-01-08 ENCOUNTER — Ambulatory Visit (HOSPITAL_COMMUNITY)
Admission: RE | Admit: 2019-01-08 | Discharge: 2019-01-08 | Disposition: A | Discharge status: Home / Self Care | Payer: 59 | Source: Ambulatory Visit | Attending: Urology | Admitting: Urology

## 2019-01-08 ENCOUNTER — Other Ambulatory Visit: Payer: Self-pay

## 2019-01-08 ENCOUNTER — Ambulatory Visit: Payer: 59 | Admitting: Urology

## 2019-01-08 DIAGNOSIS — R972 Elevated prostate specific antigen [PSA]: Secondary | ICD-10-CM | POA: Insufficient documentation

## 2019-01-08 MED ORDER — LIDOCAINE HCL (PF) 1 % IJ SOLN
INTRAMUSCULAR | Status: AC
  Administered 2019-01-08: 12:00:00 2.1 mL
  Filled 2019-01-08: qty 5

## 2019-01-08 MED ORDER — CEFTRIAXONE SODIUM 1 G IJ SOLR
INTRAMUSCULAR | Status: AC
  Administered 2019-01-08: 12:00:00 1 g via INTRAMUSCULAR
  Filled 2019-01-08: qty 10

## 2019-01-08 MED ORDER — LIDOCAINE HCL (PF) 2 % IJ SOLN
INTRAMUSCULAR | Status: AC
  Administered 2019-01-08: 10 mL
  Filled 2019-01-08: qty 10

## 2019-01-08 MED ORDER — CEFTRIAXONE SODIUM 1 G IJ SOLR
1.0000 g | Freq: Once | INTRAMUSCULAR | Status: AC
  Administered 2019-01-08: 12:00:00 1 g via INTRAMUSCULAR

## 2019-01-20 ENCOUNTER — Other Ambulatory Visit: Payer: Self-pay

## 2019-01-20 DIAGNOSIS — Z20822 Contact with and (suspected) exposure to covid-19: Secondary | ICD-10-CM

## 2019-01-21 LAB — NOVEL CORONAVIRUS, NAA: SARS-CoV-2, NAA: NOT DETECTED

## 2019-02-16 ENCOUNTER — Other Ambulatory Visit (HOSPITAL_COMMUNITY): Payer: Self-pay | Admitting: Internal Medicine

## 2019-02-16 ENCOUNTER — Other Ambulatory Visit: Payer: Self-pay | Admitting: Internal Medicine

## 2019-02-16 DIAGNOSIS — R519 Headache, unspecified: Secondary | ICD-10-CM

## 2019-02-26 ENCOUNTER — Ambulatory Visit (INDEPENDENT_AMBULATORY_CARE_PROVIDER_SITE_OTHER): Payer: 59 | Admitting: Urology

## 2019-02-26 ENCOUNTER — Other Ambulatory Visit: Payer: Self-pay

## 2019-02-26 DIAGNOSIS — N5201 Erectile dysfunction due to arterial insufficiency: Secondary | ICD-10-CM

## 2019-02-26 DIAGNOSIS — N401 Enlarged prostate with lower urinary tract symptoms: Secondary | ICD-10-CM | POA: Diagnosis not present

## 2019-02-26 DIAGNOSIS — R350 Frequency of micturition: Secondary | ICD-10-CM | POA: Diagnosis not present

## 2019-02-26 DIAGNOSIS — R972 Elevated prostate specific antigen [PSA]: Secondary | ICD-10-CM | POA: Diagnosis not present

## 2019-05-06 ENCOUNTER — Other Ambulatory Visit: Payer: Self-pay

## 2019-05-06 DIAGNOSIS — E291 Testicular hypofunction: Secondary | ICD-10-CM

## 2019-05-06 DIAGNOSIS — R972 Elevated prostate specific antigen [PSA]: Secondary | ICD-10-CM

## 2019-05-10 ENCOUNTER — Other Ambulatory Visit: Payer: Self-pay

## 2019-09-07 ENCOUNTER — Encounter: Payer: Self-pay | Admitting: *Deleted

## 2019-09-07 ENCOUNTER — Ambulatory Visit (INDEPENDENT_AMBULATORY_CARE_PROVIDER_SITE_OTHER): Payer: 59

## 2019-09-07 ENCOUNTER — Other Ambulatory Visit: Payer: Self-pay | Admitting: *Deleted

## 2019-09-07 ENCOUNTER — Ambulatory Visit: Payer: 59 | Admitting: Podiatry

## 2019-09-07 ENCOUNTER — Other Ambulatory Visit: Payer: Self-pay

## 2019-09-07 DIAGNOSIS — M778 Other enthesopathies, not elsewhere classified: Secondary | ICD-10-CM | POA: Diagnosis not present

## 2019-09-07 DIAGNOSIS — M722 Plantar fascial fibromatosis: Secondary | ICD-10-CM | POA: Diagnosis not present

## 2019-09-07 MED ORDER — MELOXICAM 15 MG PO TABS: 15.0000 mg | ORAL_TABLET | Freq: Every day | ORAL | 3 refills | Status: DC

## 2019-09-07 MED ORDER — METHYLPREDNISOLONE 4 MG PO TBPK: ORAL_TABLET | ORAL | 0 refills | Status: DC

## 2019-09-07 NOTE — Patient Instructions (Signed)
Plantar Fasciitis Rehab Ask your health care provider which exercises are safe for you. Do exercises exactly as told by your health care provider and adjust them as directed. It is normal to feel mild stretching, pulling, tightness, or discomfort as you do these exercises. Stop right away if you feel sudden pain or your pain gets worse. Do not begin these exercises until told by your health care provider. Stretching and range-of-motion exercises These exercises warm up your muscles and joints and improve the movement and flexibility of your foot. These exercises also help to relieve pain. Plantar fascia stretch  1. Sit with your left / right leg crossed over your opposite knee. 2. Hold your heel with one hand with that thumb near your arch. With your other hand, hold your toes and gently pull them back toward the top of your foot. You should feel a stretch on the bottom of your toes or your foot (plantar fascia) or both. 3. Hold this stretch for__________ seconds. 4. Slowly release your toes and return to the starting position. Repeat __________ times. Complete this exercise __________ times a day. Gastrocnemius stretch, standing This exercise is also called a calf (gastroc) stretch. It stretches the muscles in the back of the upper calf. 1. Stand with your hands against a wall. 2. Extend your left / right leg behind you, and bend your front knee slightly. 3. Keeping your heels on the floor and your back knee straight, shift your weight toward the wall. Do not arch your back. You should feel a gentle stretch in your upper left / right calf. 4. Hold this position for __________ seconds. Repeat __________ times. Complete this exercise __________ times a day. Soleus stretch, standing This exercise is also called a calf (soleus) stretch. It stretches the muscles in the back of the lower calf. 1. Stand with your hands against a wall. 2. Extend your left / right leg behind you, and bend your front  knee slightly. 3. Keeping your heels on the floor, bend your back knee and shift your weight slightly over your back leg. You should feel a gentle stretch deep in your lower calf. 4. Hold this position for __________ seconds. Repeat __________ times. Complete this exercise __________ times a day. Gastroc and soleus stretch, standing step This exercise stretches the muscles in the back of the lower leg. These muscles are in the upper calf (gastrocnemius) and the lower calf (soleus). 1. Stand with the ball of your left / right foot on a step. The ball of your foot is on the walking surface, right under your toes. 2. Keep your other foot firmly on the same step. 3. Hold on to the wall or a railing for balance. 4. Slowly lift your other foot, allowing your body weight to press your left / right heel down over the edge of the step. You should feel a stretch in your left / right calf. 5. Hold this position for __________ seconds. 6. Return both feet to the step. 7. Repeat this exercise with a slight bend in your left / right knee. Repeat __________ times with your left / right knee straight and __________ times with your left / right knee bent. Complete this exercise __________ times a day. Balance exercise This exercise builds your balance and strength control of your arch to help take pressure off your plantar fascia. Single leg stand If this exercise is too easy, you can try it with your eyes closed or while standing on a pillow. 1.   Without shoes, stand near a railing or in a doorway. You may hold on to the railing or door frame as needed. 2. Stand on your left / right foot. Keep your big toe down on the floor and try to keep your arch lifted. Do not let your foot roll inward. 3. Hold this position for __________ seconds. Repeat __________ times. Complete this exercise __________ times a day. This information is not intended to replace advice given to you by your health care provider. Make sure  you discuss any questions you have with your health care provider. Document Revised: 08/06/2018 Document Reviewed: 02/11/2018 Elsevier Patient Education  2020 [employer] Inc.  

## 2019-09-07 NOTE — Progress Notes (Signed)
Subjective:  Patient ID: Brandon Giles, male    DOB: July 22, 1960,  MRN: [id number] HPI No chief complaint on file.   59 y.o. male presents with the above complaint.   ROS: Denies fever chills nausea vomiting muscle aches pains calf pain back pain chest pain shortness of breath.  Past Medical History:  Diagnosis Date  . Allergy    seasonal  . HOH (hard of hearing)    left ear  . Hypertension   . PONV (postoperative nausea and vomiting)    after hernia repair  . Shortness of breath    with exercise-states 'out of shape'   Past Surgical History:  Procedure Laterality Date  . GANGLION CYST EXCISION  12/25/2011   Procedure: REMOVAL GANGLION OF WRIST;  Surgeon: Magnus Sinning, MD;  Location: [facility];  Service: Orthopedics;  Laterality: Left;  excision of ganglion cyst of left wrist   . HERNIA REPAIR  mid '90's   bilateral ing  . KNEE ARTHROSCOPY  '99   left   . NASAL SEPTOPLASTY W/ TURBINOPLASTY Bilateral 06/04/2013   Procedure: BILATERNAL NASAL SEPTOPLASTY WITH TURBINATE REDUCTION;  Surgeon: Izora Gala, MD;  Location: [facility];  Service: ENT;  Laterality: Bilateral;  . SEPTOPLASTY    . TONSILLECTOMY      Current Outpatient Medications:  .  cetirizine (ZYRTEC) 10 MG tablet, Take 10 mg by mouth daily., Disp: , Rfl:  .  Coenzyme Q10 (COQ10 PO), Take by mouth., Disp: , Rfl:  .  Omega-3 Fatty Acids (OMEGA 3 PO), Take by mouth., Disp: , Rfl:  .  amLODipine (NORVASC) 5 MG tablet, Take 5 mg by mouth daily., Disp: , Rfl:  .  levothyroxine (SYNTHROID) 50 MCG tablet, Take 50 mcg by mouth daily., Disp: , Rfl:  .  meloxicam (MOBIC) 15 MG tablet, Take 1 tablet (15 mg total) by mouth daily., Disp: 30 tablet, Rfl: 3 .  methylPREDNISolone (MEDROL DOSEPAK) 4 MG TBPK tablet, 6 day dose pack - take as directed, Disp: 21 tablet, Rfl: 0 .  metoprolol succinate (TOPROL-XL) 25 MG 24 hr tablet, Take 25 mg by mouth daily., Disp: , Rfl:  .  omeprazole (PRILOSEC)  40 MG capsule, Take 40 mg by mouth daily., Disp: , Rfl:  .  pravastatin (PRAVACHOL) 80 MG tablet, Take 40 mg by mouth daily., Disp: , Rfl:  .  sildenafil (REVATIO) 20 MG tablet, TAKE 3 TO 5 TABLETS 1VHOUR PRIOR TO INTERCOURSE., Disp: , Rfl:  .  tamsulosin (FLOMAX) 0.4 MG CAPS capsule, Take 0.4 mg by mouth daily., Disp: , Rfl:  .  telmisartan (MICARDIS) 40 MG tablet, Take 40 mg by mouth daily., Disp: , Rfl:   Allergies  Allergen Reactions  . Hydrocodone-Acetaminophen Other (See Comments)    Doesn't like how it makes him feel-dizzy,tachycardic   Review of Systems Objective:  There were no vitals filed for this visit.  General: Well developed, nourished, in no acute distress, alert and oriented x3   Dermatological: Skin is warm, dry and supple bilateral. Nails x 10 are well maintained; remaining integument appears unremarkable at this time. There are no open sores, no preulcerative lesions, no rash or signs of infection present.  Vascular: Dorsalis Pedis artery and Posterior Tibial artery pedal pulses are 2/4 bilateral with immedate capillary fill time. Pedal hair growth present. No varicosities and no lower extremity edema present bilateral.   Neruologic: Grossly intact via light touch bilateral. Vibratory intact via tuning fork bilateral. Protective threshold with Jobie Quaker  monofilament intact to all pedal sites bilateral. Patellar and Achilles deep tendon reflexes 2+ bilateral. No Babinski or clonus noted bilateral.   Musculoskeletal: No gross boney pedal deformities bilateral. No pain, crepitus, or limitation noted with foot and ankle range of motion bilateral. Muscular strength 5/5 in all groups tested bilateral.  He has some tenderness on range of motion of the first metatarsophalangeal joint of the right foot with guarding.  There appears to be no crepitation.  He has pain on palpation medial calcaneal heels bilaterally.  Gait: Unassisted, Nonantalgic.     Radiographs:  Radiographs taken today demonstrate osseously mature individual soft tissue increase in density plantar fifth calcaneal insertion site rectus foot type.  No acute findings noted joint space narrowing of the first metatarsophalangeal joint is noted right.  Assessment & Plan:   Assessment: Plantar fasciitis bilateral.  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  Plan: Discussed etiology pathology conservative versus surgical therapies.  At this point placed him on methylprednisolone to be followed by meloxicam.  Placed him into plantar fascial braces.  I injected the bilateral heels with 20 mg Kenalog 5 mg Marcaine to the point of maximal tenderness.  Like to follow-up with him in 1 month we did discuss appropriate shoe gear stretching exercises ice therapy and shoe gear modifications.     [name]. [place], [place]

## 2019-10-07 ENCOUNTER — Encounter: Payer: Self-pay | Admitting: Podiatry

## 2019-10-07 ENCOUNTER — Other Ambulatory Visit: Payer: Self-pay

## 2019-10-07 ENCOUNTER — Ambulatory Visit: Payer: 59 | Admitting: Podiatry

## 2019-10-07 DIAGNOSIS — M778 Other enthesopathies, not elsewhere classified: Secondary | ICD-10-CM

## 2019-10-07 NOTE — Progress Notes (Signed)
Presents today for follow-up of his plantar fasciitis.  States that he really has no pain anymore he says is really great he is very happy with the outcome.  He does have some tenderness still on the first metatarsophalangeal joint of the right foot he says he gets a little uncomfortable at times.  Objective: Vital signs are stable alert and oriented x3 there is no erythema edema/drainage odor he has no pain on palpation medial calcaneal tubercles bilateral.  He does have some tenderness on palpation of the first metatarsophalangeal joint of the right foot with limited range of motion.  This is consistent with capsulitis and degenerative joint disease of the first metatarsophalangeal joint right foot.  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  Well-healing plantar fasciitis bilateral.  Plan: Discussed etiology pathology and surgical therapies at this point recommend he continue current therapies for the plantar fasciitis I did inject 10 mg of Kenalog after sterile Betadine skin prep through a 30-gauge half inch needle into the first metatarsophalangeal joint.  He tolerated procedure well without complications we will follow-up with him on an as-needed basis.

## 2020-03-17 ENCOUNTER — Other Ambulatory Visit: Payer: Self-pay

## 2020-03-17 DIAGNOSIS — E291 Testicular hypofunction: Secondary | ICD-10-CM

## 2020-03-20 ENCOUNTER — Other Ambulatory Visit: Payer: 59

## 2020-03-20 ENCOUNTER — Other Ambulatory Visit: Payer: Self-pay

## 2020-03-20 DIAGNOSIS — E291 Testicular hypofunction: Secondary | ICD-10-CM

## 2020-03-21 ENCOUNTER — Telehealth: Payer: Self-pay

## 2020-03-21 LAB — HEMATOCRIT: Hematocrit: 46.5 % (ref 37.5–51.0)

## 2020-03-21 LAB — TESTOSTERONE: Testosterone: 383 ng/dL (ref 264–916)

## 2020-03-21 LAB — HEMOGLOBIN: Hemoglobin: 15.7 g/dL (ref 13.0–17.7)

## 2020-03-21 NOTE — Telephone Encounter (Signed)
Pt all ready has appt for f/u. His blood work was drawn yesterday and he had abstained for a week or more. I was able to add a PSA to this. Pt notified.

## 2020-03-21 NOTE — Telephone Encounter (Signed)
-----   Message from Irine Seal, MD sent at 03/17/2020  1:50 PM EST ----- His PSA is up to 7 from 4.4.  He needs f/u with me with a repeat PSA after a week of sexual abstinence.   ----- Message ----- From: Valentina Lucks, LPN Sent: [id number]  12:08 PM EST To: Irine Seal, MD   ----- Message ----- From: Charlestine Massed Sent: 03/17/2020  11:50 AM EST To: [facility]  Please review.

## 2020-03-31 ENCOUNTER — Ambulatory Visit: Payer: 59 | Admitting: Urology

## 2020-04-04 ENCOUNTER — Ambulatory Visit: Payer: 59 | Admitting: Podiatry

## 2020-04-04 ENCOUNTER — Other Ambulatory Visit: Payer: Self-pay

## 2020-04-04 ENCOUNTER — Encounter: Payer: Self-pay | Admitting: Podiatry

## 2020-04-04 DIAGNOSIS — M778 Other enthesopathies, not elsewhere classified: Secondary | ICD-10-CM

## 2020-04-04 DIAGNOSIS — M722 Plantar fascial fibromatosis: Secondary | ICD-10-CM

## 2020-04-04 MED ORDER — TRIAMCINOLONE ACETONIDE 40 MG/ML IJ SUSP
20.0000 mg | Freq: Once | INTRAMUSCULAR | Status: AC
  Administered 2020-04-04: 20 mg

## 2020-04-04 MED ORDER — DEXAMETHASONE SODIUM PHOSPHATE 120 MG/30ML IJ SOLN
2.0000 mg | Freq: Once | INTRAMUSCULAR | Status: AC
  Administered 2020-04-04: 2 mg via INTRA_ARTICULAR

## 2020-04-04 MED ORDER — MELOXICAM 15 MG PO TABS: 15.0000 mg | ORAL_TABLET | Freq: Every day | ORAL | 3 refills | Status: DC

## 2020-04-04 NOTE — Progress Notes (Signed)
He presents today for follow-up of his right heel pain and right first metatarsophalangeal joint pain. States that he has still have pain but is better than it was.  Objective: Vital signs are stable alert oriented x3 there is no erythema edema cellulitis drainage or odor. Pain on palpation medial calcaneal tubercle is present. Also has pain on palpation and end range of motion of the first metatarsophalangeal joint of the right foot.  Assessment: Resolving capsulitis and plantar fasciitis.  Plan: At this point I highly recommended that we get him some orthotics built but he like to have another set of injections. Someone had injected the first metatarsophalangeal joint after sterile Betadine skin prep with dexamethasone and then the right heel with Kenalog and local anesthetic. Follow-up with him in a few weeks to reevaluate.

## 2020-04-12 LAB — SPECIMEN STATUS REPORT

## 2020-04-12 LAB — PSA: Prostate Specific Ag, Serum: 7.6 ng/mL — ABNORMAL HIGH (ref 0.0–4.0)

## 2020-05-25 ENCOUNTER — Encounter: Payer: Self-pay | Admitting: Urology

## 2020-05-25 ENCOUNTER — Ambulatory Visit (INDEPENDENT_AMBULATORY_CARE_PROVIDER_SITE_OTHER): Payer: 59 | Admitting: Urology

## 2020-05-25 ENCOUNTER — Other Ambulatory Visit: Payer: Self-pay

## 2020-05-25 VITALS — BP 146/82 | HR 96 | Temp 98.0°F | Ht 68.0 in | Wt 209.0 lb

## 2020-05-25 DIAGNOSIS — R972 Elevated prostate specific antigen [PSA]: Secondary | ICD-10-CM

## 2020-05-25 DIAGNOSIS — N401 Enlarged prostate with lower urinary tract symptoms: Secondary | ICD-10-CM

## 2020-05-25 DIAGNOSIS — N5201 Erectile dysfunction due to arterial insufficiency: Secondary | ICD-10-CM

## 2020-05-25 DIAGNOSIS — R350 Frequency of micturition: Secondary | ICD-10-CM

## 2020-05-25 DIAGNOSIS — E291 Testicular hypofunction: Secondary | ICD-10-CM

## 2020-05-25 LAB — URINALYSIS, ROUTINE W REFLEX MICROSCOPIC
Bilirubin, UA: NEGATIVE
Glucose, UA: NEGATIVE
Ketones, UA: NEGATIVE
Leukocytes,UA: NEGATIVE
Nitrite, UA: NEGATIVE
Protein,UA: NEGATIVE
RBC, UA: NEGATIVE
Specific Gravity, UA: 1.02 (ref 1.005–1.030)
Urobilinogen, Ur: 0.2 mg/dL (ref 0.2–1.0)
pH, UA: 5.5 (ref 5.0–7.5)

## 2020-05-25 LAB — BLADDER SCAN AMB NON-IMAGING: Scan Result: 0

## 2020-05-25 MED ORDER — SILDENAFIL CITRATE 20 MG PO TABS
ORAL_TABLET | ORAL | 11 refills | Status: DC
Start: 2020-05-25 — End: 2021-01-12

## 2020-05-25 MED ORDER — ALFUZOSIN HCL ER 10 MG PO TB24: 10.0000 mg | ORAL_TABLET | Freq: Every day | ORAL | 11 refills | Status: DC

## 2020-05-25 NOTE — Progress Notes (Signed)
Patient ID: Brandon Giles, male   DOB: 12/24/1960, 60 y.o.   MRN: [id number]  Subjective:  1. Elevated prostate specific antigen (PSA)   2. Benign localized prostatic hyperplasia with lower urinary tract symptoms (LUTS)   3. Urinary frequency   4. Erectile dysfunction due to arterial insufficiency   5. Primary hypogonadism in male     Brandon Giles returns today in f/u for his history of an elevated PSA of 4.4  with a negative biopsy in 9/20.   He has BPH with BOO and has been on tamsulosin.  He has ED and uses sildenafil.  He has hypogonadism and has been on clomiphene.    05/25/20:  His PSA was up to 7.6 in 03/20/20.   His testosterone is normal at 383 with a normal Hgb.  He has moderate to severe LUTS with an IPSS of 21.  He primarily complains of urgency and frequency.  He has nocturia 3-4x.  He drinks a lot of fluid.  He has 2 cups of coffee qam. His PVR today is low.  He had inadequate volume for a flow rate.   He has no sensation of ejaculation with the tamsulosin.   He has no dysuria or hematuria.      IPSS    Row Name 05/25/20 0900         International Prostate Symptom Score   How often have you had the sensation of not emptying your bladder? About half the time     How often have you had to urinate less than every two hours? More than half the time     How often have you found you stopped and started again several times when you urinated? Less than half the time     How often have you found it difficult to postpone urination? More than half the time     How often have you had a weak urinary stream? About half the time     How often have you had to strain to start urination? Less than half the time     How many times did you typically get up at night to urinate? 3 Times     Total IPSS Score 21           Quality of Life due to urinary symptoms   If you were to spend the rest of your life with your urinary condition just the way it is now how would you feel about that? Mostly  Disatisfied             ROS:  ROS:  A complete review of systems was performed.  All systems are negative except for pertinent findings as noted.   ROS  Allergies  Allergen Reactions  . Hydrocodone-Acetaminophen Other (See Comments)    Doesn't like how it makes him feel-dizzy,tachycardic    Outpatient Encounter Medications as of 05/25/2020  Medication Sig  . alfuzosin (UROXATRAL) 10 MG 24 hr tablet Take 1 tablet (10 mg total) by mouth daily with breakfast.  . amLODipine (NORVASC) 5 MG tablet Take 5 mg by mouth daily.  . cetirizine (ZYRTEC) 10 MG tablet Take 10 mg by mouth daily.  Marland Kitchen levothyroxine (SYNTHROID) 50 MCG tablet Take 50 mcg by mouth daily.  . meloxicam (MOBIC) 15 MG tablet Take 1 tablet (15 mg total) by mouth daily.  . Omega-3 Fatty Acids (OMEGA 3 PO) Take by mouth.  Marland Kitchen omeprazole (PRILOSEC) 40 MG capsule Take 40 mg by mouth daily.  . pravastatin (PRAVACHOL)  80 MG tablet Take 40 mg by mouth daily.  Marland Kitchen telmisartan (MICARDIS) 40 MG tablet Take 40 mg by mouth daily.  . [DISCONTINUED] sildenafil (REVATIO) 20 MG tablet TAKE 3 TO 5 TABLETS 1VHOUR PRIOR TO INTERCOURSE.  . [DISCONTINUED] tamsulosin (FLOMAX) 0.4 MG CAPS capsule Take 0.4 mg by mouth daily.  . sildenafil (REVATIO) 20 MG tablet TAKE 3 TO 5 TABLETS 1 HOUR PRIOR TO INTERCOURSE.  . [DISCONTINUED] Coenzyme Q10 (COQ10 PO) Take by mouth.  . [DISCONTINUED] metoprolol succinate (TOPROL-XL) 25 MG 24 hr tablet Take 25 mg by mouth daily.  . [DISCONTINUED] tiZANidine (ZANAFLEX) 4 MG tablet Take 4 mg by mouth 4 (four) times daily as needed.   No facility-administered encounter medications on file as of 05/25/2020.    Past Medical History:  Diagnosis Date  . Allergy    seasonal  . HOH (hard of hearing)    left ear  . Hypertension   . PONV (postoperative nausea and vomiting)    after hernia repair  . Shortness of breath    with exercise-states 'out of shape'    Past Surgical History:  Procedure Laterality Date  .  GANGLION CYST EXCISION  12/25/2011   Procedure: REMOVAL GANGLION OF WRIST;  Surgeon: Magnus Sinning, MD;  Location: [facility];  Service: Orthopedics;  Laterality: Left;  excision of ganglion cyst of left wrist   . HERNIA REPAIR  mid '90's   bilateral ing  . KNEE ARTHROSCOPY  '99   left   . NASAL SEPTOPLASTY W/ TURBINOPLASTY Bilateral 06/04/2013   Procedure: BILATERNAL NASAL SEPTOPLASTY WITH TURBINATE REDUCTION;  Surgeon: Izora Gala, MD;  Location: [facility];  Service: ENT;  Laterality: Bilateral;  . SEPTOPLASTY    . TONSILLECTOMY      Social History   Socioeconomic History  . Marital status: Married    Spouse name: Not on file  . Number of children: Not on file  . Years of education: Not on file  . Highest education level: Not on file  Occupational History  . Not on file  Tobacco Use  . Smoking status: Never Smoker  . Smokeless tobacco: Never Used  Substance and Sexual Activity  . Alcohol use: No  . Drug use: No  . Sexual activity: Not on file  Other Topics Concern  . Not on file  Social History Narrative  . Not on file   Social Determinants of Health   Financial Resource Strain: Not on file  Food Insecurity: Not on file  Transportation Needs: Not on file  Physical Activity: Not on file  Stress: Not on file  Social Connections: Not on file  Intimate Partner Violence: Not on file    Family History  Problem Relation Age of Onset  . Heart disease Mother   . Lung cancer Mother   . Stroke Mother   . Diabetes Father   . Colon cancer Neg Hx   . Esophageal cancer Neg Hx   . Rectal cancer Neg Hx   . Stomach cancer Neg Hx        Objective: Vitals:   05/25/20 0936  BP: (!) 146/82  Pulse: 96  Temp: 98 F (36.7 C)     Physical Exam Vitals reviewed.  Constitutional:      Appearance: Normal appearance.  Genitourinary:    Comments: AP without lesions. NST without mass. Prostate 2.5+ benign. SV non-palpable.   Neurological:     Mental Status: He is alert.     Lab Results:  PSA No results found for: PSA Testosterone  Date Value Ref Range Status  03/20/2020 383 264 - 916 ng/dL Final    Comment:    Adult male reference interval is based on a population of healthy nonobese males (BMI <30) between 78 and 49 years old. [place], [place] [id number]. PMID: [id number].    Lab Results  Component Value Date   PSA1 7.6 (H) 03/20/2020   Lab Results  Component Value Date   HCT 46.5 03/20/2020   Lab Results  Component Value Date   HGB 15.7 03/20/2020    UA is clear.   Studies/Results: No results found. No results found for this or any previous visit.  No results found for this or any previous visit.  No results found for this or any previous visit.  No results found for this or any previous visit.  No results found for this or any previous visit.  No results found for this or any previous visit.  No results found for this or any previous visit.  No results found for this or any previous visit.    Assessment & Plan: Elevated PSA.  His PSA is up.   I will get a repeat today and order an MRIP.  I will call with the results.  BPH with BOO with OAB.   His symptoms have worsened and he has some ejaculatory dysfunction.  I am going to change him to alfuzosin from tamsulosin.  ED. Sildenafil refilled.  Hypogonadism.  His testosterone has normalized on no therapy.      Meds ordered this encounter  Medications  . alfuzosin (UROXATRAL) 10 MG 24 hr tablet    Sig: Take 1 tablet (10 mg total) by mouth daily with breakfast.    Dispense:  30 tablet    Refill:  11  . sildenafil (REVATIO) 20 MG tablet    Sig: TAKE 3 TO 5 TABLETS 1 HOUR PRIOR TO INTERCOURSE.    Dispense:  50 tablet    Refill:  11     Orders Placed This Encounter  Procedures  . MR PROSTATE W WO CONTRAST    Rising PSA with negative prior biopsy.    Standing Status:   Future    Standing Expiration  Date:   06/25/2020    Order Specific Question:   If indicated for the ordered procedure, I authorize the administration of contrast media per Radiology protocol    Answer:   Yes    Order Specific Question:   What is the patient's sedation requirement?    Answer:   No Sedation    Order Specific Question:   Does the patient have a pacemaker or implanted devices?    Answer:   No    Order Specific Question:   Radiology Contrast Protocol - do NOT remove file path    Answer:   [email or web address]    Order Specific Question:   Preferred imaging location?    Answer:   [facility] (table limit-550lbs)  . Urinalysis, Routine w reflex microscopic  . PSA, total and free  . Basic metabolic panel  . BLADDER SCAN AMB NON-IMAGING      Return in about 4 weeks (around 06/22/2020) for To discuss MRI results and assess response to alfuzosin. .   CC: Redmond School, MD      Irine Seal 05/25/2020

## 2020-05-25 NOTE — Progress Notes (Signed)
Uroflow  Peak Flow: 52ml Average Flow: 48ml Voided Volume: 45ml Voiding Time: 15sec Flow Time: 10sec Time to Peak Flow: 1sec  PVR Volume: 55ml  Urological Symptom Review  Patient is experiencing the following symptoms: Frequent urination Hard to postpone urination Get up at night to urinate Stream starts and stops Have to strain to urinate Weak stream Erection problems (male only)   Review of Systems  Gastrointestinal (upper)  : Negative for upper GI symptoms  Gastrointestinal (lower) : Negative for lower GI symptoms  Constitutional : Negative for symptoms  Skin: Negative for skin symptoms  Eyes: Negative for eye symptoms  Ear/Nose/Throat : Sinus problems  Hematologic/Lymphatic: Negative for Hematologic/Lymphatic symptoms  Cardiovascular : Negative for cardiovascular symptoms  Respiratory : Negative for respiratory symptoms  Endocrine: Negative for endocrine symptoms  Musculoskeletal: Negative for musculoskeletal symptoms  Neurological: Negative for neurological symptoms  Psychologic: Negative for psychiatric symptoms

## 2020-05-26 LAB — BASIC METABOLIC PANEL
BUN/Creatinine Ratio: 14 (ref 9–20)
BUN: 16 mg/dL (ref 6–24)
CO2: 21 mmol/L (ref 20–29)
Calcium: 9.4 mg/dL (ref 8.7–10.2)
Chloride: 104 mmol/L (ref 96–106)
Creatinine, Ser: 1.16 mg/dL (ref 0.76–1.27)
GFR calc Af Amer: 80 mL/min/{1.73_m2} (ref >59)
GFR calc non Af Amer: 70 mL/min/{1.73_m2} (ref >59)
Glucose: 104 mg/dL — ABNORMAL HIGH (ref 65–99)
Potassium: 4 mmol/L (ref 3.5–5.2)
Sodium: 141 mmol/L (ref 134–144)

## 2020-05-26 LAB — PSA, TOTAL AND FREE
PSA, Free Pct: 17.3 %
PSA, Free: 1.37 ng/mL
Prostate Specific Ag, Serum: 7.9 ng/mL — ABNORMAL HIGH (ref 0.0–4.0)

## 2020-05-26 NOTE — Progress Notes (Signed)
Pt called and message left to return call to office. Information sent via my chart.

## 2020-06-13 ENCOUNTER — Other Ambulatory Visit: Payer: Self-pay

## 2020-06-13 MED ORDER — ALFUZOSIN HCL ER 10 MG PO TB24: 10.0000 mg | ORAL_TABLET | Freq: Every day | ORAL | 11 refills | Status: DC

## 2020-06-13 NOTE — Progress Notes (Signed)
Pharmacy change

## 2020-06-17 ENCOUNTER — Other Ambulatory Visit: Payer: Self-pay

## 2020-06-17 ENCOUNTER — Ambulatory Visit
Admission: RE | Admit: 2020-06-17 | Discharge: 2020-06-17 | Disposition: A | Discharge status: Home / Self Care | Payer: 59 | Source: Ambulatory Visit | Attending: Urology | Admitting: Urology

## 2020-06-17 DIAGNOSIS — R972 Elevated prostate specific antigen [PSA]: Secondary | ICD-10-CM

## 2020-06-17 MED ORDER — GADOBENATE DIMEGLUMINE 529 MG/ML IV SOLN
18.0000 mL | Freq: Once | INTRAVENOUS | Status: AC | PRN
  Administered 2020-06-17: 18 mL via INTRAVENOUS

## 2020-06-19 NOTE — Progress Notes (Signed)
Results and appt sent via my chart.

## 2020-06-19 NOTE — Progress Notes (Signed)
The MRI shows no worrisome lesions.  There is a suggestion of prostatic inflammation.  I would like a repeat PSA in 3 months and if there is a further increase, we may need to consider another biopsy despite the MRI findings.

## 2020-07-05 NOTE — Progress Notes (Signed)
Patient ID: Brandon Giles, male   DOB: Jan 25, 1961, 60 y.o.   MRN: [id number]  Subjective:  1. Elevated prostate specific antigen (PSA)   2. BPH with urinary obstruction   3. Urinary frequency   4. Constipation, unspecified constipation type   5. Acute right-sided low back pain without sciatica   6. Erectile dysfunction due to arterial insufficiency     Brandon Giles returns today in f/u for his history of an elevated PSA of 4.4  with a negative biopsy in 9/20.   He has BPH with BOO and has been on tamsulosin.  He has ED and uses sildenafil.  He has hypogonadism and has been on clomiphene.    05/25/20:  His PSA was up to 7.6 in 03/20/20.   His testosterone is normal at 383 with a normal Hgb.  He has moderate to severe LUTS with an IPSS of 21.  He primarily complains of urgency and frequency.  He has nocturia 3-4x.  He drinks a lot of fluid.  He has 2 cups of coffee qam. His PVR today is low.  He had inadequate volume for a flow rate.   He has no sensation of ejaculation with the tamsulosin.   He has no dysuria or hematuria.      3/10/22[name] returns today in f/u.  His PSA was up to 7.9 in 1/22 and a prostate MRI was ordered.  He was switched to alfuzosin at his last visit.  He feels it is working better but he has fewer bowel movements and he fells bloated and has some back pain in the right low back.  His IPSS is 20.   The prostate MRI showed no high risk lesions but he had PIRADs 2 changes in the PZ that were non-specific and could be associated with prostatitis but his UA's have been clear.   He continues to use sildenafil but is off of clomid.    IPSS    Row Name 07/06/20 0800         International Prostate Symptom Score   How often have you had the sensation of not emptying your bladder? About half the time     How often have you had to urinate less than every two hours? More than half the time     How often have you found you stopped and started again several times when you urinated? About  half the time     How often have you found it difficult to postpone urination? About half the time     How often have you had a weak urinary stream? About half the time     How often have you had to strain to start urination? Less than 1 in 5 times     How many times did you typically get up at night to urinate? 3 Times     Total IPSS Score 20           Quality of Life due to urinary symptoms   If you were to spend the rest of your life with your urinary condition just the way it is now how would you feel about that? Mostly Disatisfied            ROS:  ROS:  A complete review of systems was performed.  All systems are negative except for pertinent findings as noted.   ROS  Allergies  Allergen Reactions  . Hydrocodone-Acetaminophen Other (See Comments)    Doesn't like how it makes him feel-dizzy,tachycardic  Outpatient Encounter Medications as of 07/06/2020  Medication Sig  . alfuzosin (UROXATRAL) 10 MG 24 hr tablet Take 1 tablet (10 mg total) by mouth daily with breakfast.  . amLODipine (NORVASC) 5 MG tablet Take 5 mg by mouth daily.  . cetirizine (ZYRTEC) 10 MG tablet Take 10 mg by mouth daily.  Marland Kitchen levothyroxine (SYNTHROID) 50 MCG tablet Take 50 mcg by mouth daily.  . Omega-3 Fatty Acids (OMEGA 3 PO) Take by mouth.  Marland Kitchen omeprazole (PRILOSEC) 40 MG capsule Take 40 mg by mouth daily.  . pravastatin (PRAVACHOL) 80 MG tablet Take 40 mg by mouth daily.  . sildenafil (REVATIO) 20 MG tablet TAKE 3 TO 5 TABLETS 1 HOUR PRIOR TO INTERCOURSE.  Marland Kitchen telmisartan (MICARDIS) 40 MG tablet Take 40 mg by mouth daily.  . meloxicam (MOBIC) 15 MG tablet Take 1 tablet (15 mg total) by mouth daily. (Patient not taking: Reported on 07/06/2020)   No facility-administered encounter medications on file as of 07/06/2020.    Past Medical History:  Diagnosis Date  . Allergy    seasonal  . HOH (hard of hearing)    left ear  . Hypertension   . PONV (postoperative nausea and vomiting)    after hernia  repair  . Shortness of breath    with exercise-states 'out of shape'    Past Surgical History:  Procedure Laterality Date  . GANGLION CYST EXCISION  12/25/2011   Procedure: REMOVAL GANGLION OF WRIST;  Surgeon: Magnus Sinning, MD;  Location: [facility];  Service: Orthopedics;  Laterality: Left;  excision of ganglion cyst of left wrist   . HERNIA REPAIR  mid '90's   bilateral ing  . KNEE ARTHROSCOPY  '99   left   . NASAL SEPTOPLASTY W/ TURBINOPLASTY Bilateral 06/04/2013   Procedure: BILATERNAL NASAL SEPTOPLASTY WITH TURBINATE REDUCTION;  Surgeon: Izora Gala, MD;  Location: [facility];  Service: ENT;  Laterality: Bilateral;  . SEPTOPLASTY    . TONSILLECTOMY      Social History   Socioeconomic History  . Marital status: Married    Spouse name: Not on file  . Number of children: Not on file  . Years of education: Not on file  . Highest education level: Not on file  Occupational History  . Not on file  Tobacco Use  . Smoking status: Never Smoker  . Smokeless tobacco: Never Used  Substance and Sexual Activity  . Alcohol use: No  . Drug use: No  . Sexual activity: Not on file  Other Topics Concern  . Not on file  Social History Narrative  . Not on file   Social Determinants of Health   Financial Resource Strain: Not on file  Food Insecurity: Not on file  Transportation Needs: Not on file  Physical Activity: Not on file  Stress: Not on file  Social Connections: Not on file  Intimate Partner Violence: Not on file    Family History  Problem Relation Age of Onset  . Heart disease Mother   . Lung cancer Mother   . Stroke Mother   . Diabetes Father   . Colon cancer Neg Hx   . Esophageal cancer Neg Hx   . Rectal cancer Neg Hx   . Stomach cancer Neg Hx        Objective: Vitals:   07/06/20 0851  BP: (!) 147/94  Pulse: 65  Temp: (!) 97.5 F (36.4 C)     Physical Exam  Lab Results:  PSA No  results found for:  PSA Testosterone  Date Value Ref Range Status  03/20/2020 383 264 - 916 ng/dL Final    Comment:    Adult male reference interval is based on a population of healthy nonobese males (BMI <30) between 24 and 41 years old. [place], [place] [phone]. PMID: 17494496.    Lab Results  Component Value Date   PSA1 7.9 (H) 05/25/2020   Lab Results  Component Value Date   HCT 46.5 03/20/2020   Lab Results  Component Value Date   HGB 15.7 03/20/2020    UA is clear.   Studies/Results: MRI reviewed and noted above.     Assessment & Plan: Elevated PSA.  His PSA was up more but the MRI was non-specific.  I will repeat the PSA in May as previously ordered and if still rising, he will need a repeat biopsy.  BPH with BOO with OAB.   He reports some improvement with the alfuzosin but has constipation and right low back pain.  I discussed changing to silodosin but he would like to try to manage the constipation so I have recommended daily Miralax and possibly Mg Citrate.    Right low back pain.  I am going to get a Lumbar series.   ED. Contine Sildenafil     No orders of the defined types were placed in this encounter.    Orders Placed This Encounter  Procedures  . Lumbar Spine 2-3 Views    Standing Status:   Future    Standing Expiration Date:   08/06/2020    Order Specific Question:   Reason for Exam (SYMPTOM  OR DIAGNOSIS REQUIRED)    Answer:   low back pain    Order Specific Question:   Preferred imaging location?    Answer:   [facility]  . Urinalysis, Routine w reflex microscopic      Return in about 3 months (around 10/06/2020).   CC: Redmond School, MD      Irine Seal 07/06/2020

## 2020-07-06 ENCOUNTER — Encounter: Payer: Self-pay | Admitting: Urology

## 2020-07-06 ENCOUNTER — Other Ambulatory Visit: Payer: Self-pay

## 2020-07-06 ENCOUNTER — Ambulatory Visit (INDEPENDENT_AMBULATORY_CARE_PROVIDER_SITE_OTHER): Payer: 59 | Admitting: Urology

## 2020-07-06 VITALS — BP 147/94 | HR 65 | Temp 97.5°F | Ht 68.0 in | Wt 208.0 lb

## 2020-07-06 DIAGNOSIS — M545 Low back pain, unspecified: Secondary | ICD-10-CM

## 2020-07-06 DIAGNOSIS — R972 Elevated prostate specific antigen [PSA]: Secondary | ICD-10-CM

## 2020-07-06 DIAGNOSIS — R350 Frequency of micturition: Secondary | ICD-10-CM | POA: Diagnosis not present

## 2020-07-06 DIAGNOSIS — N401 Enlarged prostate with lower urinary tract symptoms: Secondary | ICD-10-CM | POA: Diagnosis not present

## 2020-07-06 DIAGNOSIS — K5900 Constipation, unspecified: Secondary | ICD-10-CM | POA: Diagnosis not present

## 2020-07-06 DIAGNOSIS — N138 Other obstructive and reflux uropathy: Secondary | ICD-10-CM

## 2020-07-06 DIAGNOSIS — N5201 Erectile dysfunction due to arterial insufficiency: Secondary | ICD-10-CM

## 2020-07-06 LAB — URINALYSIS, ROUTINE W REFLEX MICROSCOPIC
Bilirubin, UA: NEGATIVE
Glucose, UA: NEGATIVE
Nitrite, UA: NEGATIVE
Protein,UA: NEGATIVE
RBC, UA: NEGATIVE
Specific Gravity, UA: 1.02 (ref 1.005–1.030)
Urobilinogen, Ur: 0.2 mg/dL (ref 0.2–1.0)
pH, UA: 7 (ref 5.0–7.5)

## 2020-07-06 LAB — MICROSCOPIC EXAMINATION
Bacteria, UA: NONE SEEN
Epithelial Cells (non renal): NONE SEEN /hpf (ref 0–10)
RBC, Urine: NONE SEEN /hpf (ref 0–2)
Renal Epithel, UA: NONE SEEN /hpf

## 2020-07-06 NOTE — Progress Notes (Signed)
Urological Symptom Review  Patient is experiencing the following symptoms: Frequent urination Hard to postpone urination Get up at night to urinate Stream starts and stops Trouble starting stream Have to strain to urinate Weak stream Erection problems (male only)   Review of Systems  Gastrointestinal (upper)  : Negative for upper GI symptoms  Gastrointestinal (lower) : Constipation  Constitutional : Negative for symptoms  Skin: Negative for skin symptoms  Eyes: Negative for eye symptoms  Ear/Nose/Throat : Sinus problems  Hematologic/Lymphatic: Negative for Hematologic/Lymphatic symptoms  Cardiovascular : Negative for cardiovascular symptoms  Respiratory : Negative for respiratory symptoms  Endocrine: Negative for endocrine symptoms  Musculoskeletal: Back pain  Neurological: Negative for neurological symptoms  Psychologic: Negative for psychiatric symptoms

## 2020-09-04 ENCOUNTER — Other Ambulatory Visit: Payer: 59

## 2020-09-14 ENCOUNTER — Ambulatory Visit: Payer: 59 | Admitting: Urology

## 2020-09-29 ENCOUNTER — Other Ambulatory Visit: Payer: Self-pay

## 2020-09-29 ENCOUNTER — Other Ambulatory Visit: Payer: 59

## 2020-09-30 LAB — FPSA% REFLEX
% FREE PSA: 20.9 %
PSA, FREE: 1.59 ng/mL

## 2020-09-30 LAB — PSA TOTAL (REFLEX TO FREE): Prostate Specific Ag, Serum: 7.6 ng/mL — ABNORMAL HIGH (ref 0.0–4.0)

## 2020-10-12 ENCOUNTER — Other Ambulatory Visit: Payer: Self-pay

## 2020-10-12 ENCOUNTER — Ambulatory Visit: Payer: 59 | Admitting: Urology

## 2020-10-12 VITALS — BP 138/75 | HR 75

## 2020-10-12 DIAGNOSIS — R972 Elevated prostate specific antigen [PSA]: Secondary | ICD-10-CM

## 2020-10-12 DIAGNOSIS — R351 Nocturia: Secondary | ICD-10-CM

## 2020-10-12 DIAGNOSIS — R350 Frequency of micturition: Secondary | ICD-10-CM

## 2020-10-12 DIAGNOSIS — N138 Other obstructive and reflux uropathy: Secondary | ICD-10-CM

## 2020-10-12 DIAGNOSIS — N401 Enlarged prostate with lower urinary tract symptoms: Secondary | ICD-10-CM

## 2020-10-12 DIAGNOSIS — N5201 Erectile dysfunction due to arterial insufficiency: Secondary | ICD-10-CM

## 2020-10-12 LAB — URINALYSIS, ROUTINE W REFLEX MICROSCOPIC
Bilirubin, UA: NEGATIVE
Glucose, UA: NEGATIVE
Ketones, UA: NEGATIVE
Leukocytes,UA: NEGATIVE
Nitrite, UA: NEGATIVE
Protein,UA: NEGATIVE
Specific Gravity, UA: 1.01 (ref 1.005–1.030)
Urobilinogen, Ur: 0.2 mg/dL (ref 0.2–1.0)
pH, UA: 6.5 (ref 5.0–7.5)

## 2020-10-12 LAB — MICROSCOPIC EXAMINATION
Bacteria, UA: NONE SEEN
WBC, UA: NONE SEEN /hpf (ref 0–5)

## 2020-10-12 MED ORDER — OXYBUTYNIN CHLORIDE 5 MG PO TABS: 5.0000 mg | ORAL_TABLET | Freq: Every day | ORAL | 11 refills | Status: DC

## 2020-10-12 MED ORDER — ALFUZOSIN HCL ER 10 MG PO TB24
10.0000 mg | ORAL_TABLET | Freq: Every day | ORAL | 3 refills | Status: DC
Start: 2020-10-12 — End: 2021-06-26

## 2020-10-12 NOTE — Progress Notes (Signed)
Urological Symptom Review  Patient is experiencing the following symptoms: Frequent urination Hard to postpone urination Get up at night to urinate Stream starts and stops Trouble starting stream Weak stream   Review of Systems  Gastrointestinal (upper)  : Negative for upper GI symptoms  Gastrointestinal (lower) : Negative for lower GI symptoms  Constitutional : Negative for symptoms  Skin: Negative for skin symptoms  Eyes: Negative for eye symptoms  Ear/Nose/Throat : Sinus problems  Hematologic/Lymphatic: Negative for Hematologic/Lymphatic symptoms  Cardiovascular : Negative for cardiovascular symptoms  Respiratory : Negative for respiratory symptoms  Endocrine: Negative for endocrine symptoms  Musculoskeletal: Negative for musculoskeletal symptoms  Neurological: Negative for neurological symptoms  Psychologic: Negative for psychiatric symptoms

## 2020-10-12 NOTE — Progress Notes (Signed)
Patient ID: Brandon Giles, male   DOB: [birth year], 60 y.o.   MRN: [id number]  Subjective:  1. Elevated prostate specific antigen (PSA)   2. BPH with urinary obstruction   3. Urinary frequency   4. Nocturia   5. Erectile dysfunction due to arterial insufficiency     Brandon Giles returns today in f/u for his history of an elevated PSA of 4.4  with a negative biopsy in 9/20.   He has BPH with BOO and has been on tamsulosin.  He has ED and uses sildenafil.  He has hypogonadism and has been on clomiphene.    05/25/20:  His PSA was up to 7.6 in 03/20/20.   His testosterone is normal at 383 with a normal Hgb.  He has moderate to severe LUTS with an IPSS of 21.  He primarily complains of urgency and frequency.  He has nocturia 3-4x.  He drinks a lot of fluid.  He has 2 cups of coffee qam. His PVR today is low.  He had inadequate volume for a flow rate.   He has no sensation of ejaculation with the tamsulosin.   He has no dysuria or hematuria.      3/10/22[name] returns today in f/u.  His PSA was up to 7.9 in 1/22 and a prostate MRI was ordered.  He was switched to alfuzosin at his last visit.  He feels it is working better but he has fewer bowel movements and he fells bloated and has some back pain in the right low back.  His IPSS is 20.   The prostate MRI showed no high risk lesions but he had PIRADs 2 changes in the PZ that were non-specific and could be associated with prostatitis but his UA's have been clear.   He continues to use sildenafil but is off of clomid.   6/16/22[name] returns today in f/u.  His PSA is down slightly to 7.6 with a 21% f/t ratio.  He remains on alfuzosin but is off of clomiphene.  He has some difficulty voiding at night and has nocturia x 4.  His IPSS is 23.  He has some frequency.  He has intermittency at night with a return within 1 hour of voiding.  He has had no hematuria or dysuria.  His prostate volume in 9/20 was 72ml.    IPSS     Row Name 10/12/20 1400          International Prostate Symptom Score   How often have you had the sensation of not emptying your bladder? About half the time     How often have you had to urinate less than every two hours? More than half the time     How often have you found you stopped and started again several times when you urinated? More than half the time     How often have you found it difficult to postpone urination? About half the time     How often have you had a weak urinary stream? About half the time     How often have you had to strain to start urination? Less than half the time     How many times did you typically get up at night to urinate? 4 Times     Total IPSS Score 23           Quality of Life due to urinary symptoms     If you were to spend the rest of your life with your  urinary condition just the way it is now how would you feel about that? Mixed             ROS:  ROS:  A complete review of systems was performed.  All systems are negative except for pertinent findings as noted.   ROS  Allergies  Allergen Reactions   Hydrocodone-Acetaminophen Other (See Comments)    Doesn't like how it makes him feel-dizzy,tachycardic    Outpatient Encounter Medications as of 10/12/2020  Medication Sig   amLODipine (NORVASC) 5 MG tablet Take 5 mg by mouth daily.   cetirizine (ZYRTEC) 10 MG tablet Take 10 mg by mouth daily.   levothyroxine (SYNTHROID) 50 MCG tablet Take 50 mcg by mouth daily.   Omega-3 Fatty Acids (OMEGA 3 PO) Take by mouth.   omeprazole (PRILOSEC) 40 MG capsule Take 40 mg by mouth daily.   oxybutynin (DITROPAN) 5 MG tablet Take 1 tablet (5 mg total) by mouth at bedtime.   pravastatin (PRAVACHOL) 80 MG tablet Take 40 mg by mouth daily.   sildenafil (REVATIO) 20 MG tablet TAKE 3 TO 5 TABLETS 1 HOUR PRIOR TO INTERCOURSE.   telmisartan (MICARDIS) 40 MG tablet Take 20 mg by mouth daily.   [DISCONTINUED] alfuzosin (UROXATRAL) 10 MG 24 hr tablet Take 1 tablet (10 mg total) by mouth daily with  breakfast.   alfuzosin (UROXATRAL) 10 MG 24 hr tablet Take 1 tablet (10 mg total) by mouth daily with breakfast.   [DISCONTINUED] meloxicam (MOBIC) 15 MG tablet Take 1 tablet (15 mg total) by mouth daily. (Patient not taking: No sig reported)   No facility-administered encounter medications on file as of 10/12/2020.    Past Medical History:  Diagnosis Date   Allergy    seasonal   HOH (hard of hearing)    left ear   Hypertension    PONV (postoperative nausea and vomiting)    after hernia repair   Shortness of breath    with exercise-states 'out of shape'    Past Surgical History:  Procedure Laterality Date   GANGLION CYST EXCISION  12/25/2011   Procedure: [place];  Surgeon: Magnus Sinning, MD;  Location: [facility];  Service: Orthopedics;  Laterality: Left;  excision of ganglion cyst of left wrist    HERNIA REPAIR  mid '90's   bilateral ing   KNEE ARTHROSCOPY  '99   left    NASAL SEPTOPLASTY W/ TURBINOPLASTY Bilateral 06/04/2013   Procedure: BILATERNAL NASAL SEPTOPLASTY WITH TURBINATE REDUCTION;  Surgeon: Izora Gala, MD;  Location: [facility];  Service: ENT;  Laterality: Bilateral;   SEPTOPLASTY     TONSILLECTOMY      Social History   Socioeconomic History   Marital status: Married    Spouse name: Not on file   Number of children: Not on file   Years of education: Not on file   Highest education level: Not on file  Occupational History   Not on file  Tobacco Use   Smoking status: Never   Smokeless tobacco: Never  Substance and Sexual Activity   Alcohol use: No   Drug use: No   Sexual activity: Not on file  Other Topics Concern   Not on file  Social History Narrative   Not on file   Social Determinants of Health   Financial Resource Strain: Not on file  Food Insecurity: Not on file  Transportation Needs: Not on file  Physical Activity: Not on file  Stress: Not on  file  Social Connections: Not on file   Intimate Partner Violence: Not on file    Family History  Problem Relation Age of Onset   Heart disease Mother    Lung cancer Mother    Stroke Mother    Diabetes Father    Colon cancer Neg Hx    Esophageal cancer Neg Hx    Rectal cancer Neg Hx    Stomach cancer Neg Hx        Objective: Vitals:   10/12/20 1420  BP: 138/75  Pulse: 75     Physical Exam Vitals reviewed.  Constitutional:      Appearance: Normal appearance.  Genitourinary:    Comments: AP without lesions. NST without mass. Prostate 2+ benign. SV non-palpable. Neurological:     Mental Status: He is alert.    Lab Results:  PSA No results found for: PSA Testosterone  Date Value Ref Range Status  03/20/2020 383 264 - 916 ng/dL Final    Comment:    Adult male reference interval is based on a population of healthy nonobese males (BMI <30) between 47 and 62 years old. [place], [place] [phone]. PMID: [id number].    Lab Results  Component Value Date   PSA1 7.6 (H) 09/29/2020   Lab Results  Component Value Date   HCT 46.5 03/20/2020   Lab Results  Component Value Date   HGB 15.7 03/20/2020    UA is unremarkable today.   Studies/Results:     Assessment & Plan: Elevated PSA.  His PSA is back down slightly and the f/t ratio is borderline.   I am going to have him return with a PSA in 6 months.    BPH with BOO with OAB.   He reports some improvement with the alfuzosin but he still has nocturia.      ED. Contine Sildenafil     Meds ordered this encounter  Medications   alfuzosin (UROXATRAL) 10 MG 24 hr tablet    Sig: Take 1 tablet (10 mg total) by mouth daily with breakfast.    Dispense:  90 tablet    Refill:  3   oxybutynin (DITROPAN) 5 MG tablet    Sig: Take 1 tablet (5 mg total) by mouth at bedtime.    Dispense:  30 tablet    Refill:  11     Orders Placed This Encounter  Procedures   Microscopic Examination   Urinalysis, Routine w reflex microscopic   PSA,  total and free    Standing Status:   Future    Standing Expiration Date:   10/12/2021   Testosterone    Standing Status:   Future    Standing Expiration Date:   10/12/2021      Return in about 6 months (around 04/13/2021) for with labs.   CC: Redmond School, MD      Irine Seal 10/12/2020

## 2020-11-03 ENCOUNTER — Encounter: Payer: Self-pay | Admitting: Gastroenterology

## 2021-01-12 ENCOUNTER — Other Ambulatory Visit: Payer: Self-pay

## 2021-01-12 ENCOUNTER — Ambulatory Visit (AMBULATORY_SURGERY_CENTER): Payer: 59 | Admitting: *Deleted

## 2021-01-12 VITALS — Ht 68.0 in | Wt 208.0 lb

## 2021-01-12 DIAGNOSIS — Z86010 Personal history of colonic polyps: Secondary | ICD-10-CM

## 2021-01-12 MED ORDER — PLENVU 140 G PO SOLR: 1.0000 | Freq: Once | ORAL | 0 refills | Status: AC

## 2021-01-12 NOTE — Progress Notes (Signed)
Patient's pre-visit was done today over the phone with the patient due to COVID-19 pandemic. Name,DOB and address verified. Patient denies any allergies to Eggs and Soy. Patient denies any problems with anesthesia/sedation. Patient is not taking any diet pills or blood thinners. No home Oxygen. Packet of Prep instructions mailed to patient including a copy of a consent form & coupon -pt is aware. Patient understands to call us back with any questions or concerns. Patient is aware of our care-partner policy and 0000000 safety protocol.   EMMI education assigned to the patient for the procedure, sent to [facility].   The patient is COVID-19 vaccinated.  Patient has a cold will start an antibiotic for 5 days, explained to him our covid protocol and cancellation policy. Patient tested neg for covid.

## 2021-01-16 ENCOUNTER — Encounter: Payer: Self-pay | Admitting: Gastroenterology

## 2021-01-26 ENCOUNTER — Encounter: Payer: Self-pay | Admitting: Gastroenterology

## 2021-01-26 ENCOUNTER — Other Ambulatory Visit: Payer: Self-pay

## 2021-01-26 ENCOUNTER — Ambulatory Visit (AMBULATORY_SURGERY_CENTER): Payer: 59 | Admitting: Gastroenterology

## 2021-01-26 VITALS — BP 144/80 | HR 56 | Temp 98.0°F | Resp 10 | Ht 68.0 in | Wt 208.0 lb

## 2021-01-26 DIAGNOSIS — Z86010 Personal history of colonic polyps: Secondary | ICD-10-CM | POA: Diagnosis present

## 2021-01-26 MED ORDER — SODIUM CHLORIDE 0.9 % IV SOLN
500.0000 mL | Freq: Once | INTRAVENOUS | Status: DC
Start: 2021-01-26 — End: 2021-01-26

## 2021-01-26 NOTE — Patient Instructions (Signed)
Information on diverticulosis given to you today.  Repeat colonoscopy in 10 years.  Resume previous diet and medications.   YOU HAD AN ENDOSCOPIC PROCEDURE TODAY AT [facility]:   Refer to the procedure report that was given to you for any specific questions about what was found during the examination.  If the procedure report does not answer your questions, please call your gastroenterologist to clarify.  If you requested that your care partner not be given the details of your procedure findings, then the procedure report has been included in a sealed envelope for you to review at your convenience later.  YOU SHOULD EXPECT: Some feelings of bloating in the abdomen. Passage of more gas than usual.  Walking can help get rid of the air that was put into your GI tract during the procedure and reduce the bloating. If you had a lower endoscopy (such as a colonoscopy or flexible sigmoidoscopy) you may notice spotting of blood in your stool or on the toilet paper. If you underwent a bowel prep for your procedure, you may not have a normal bowel movement for a few days.  Please Note:  You might notice some irritation and congestion in your nose or some drainage.  This is from the oxygen used during your procedure.  There is no need for concern and it should clear up in a day or so.  SYMPTOMS TO REPORT IMMEDIATELY:  Following lower endoscopy (colonoscopy or flexible sigmoidoscopy):  Excessive amounts of blood in the stool  Significant tenderness or worsening of abdominal pains  Swelling of the abdomen that is new, acute  Fever of 100F or higher  For urgent or emergent issues, a gastroenterologist can be reached at any hour by calling [phone]. Do not use MyChart messaging for urgent concerns.    DIET:  We do recommend a small meal at first, but then you may proceed to your regular diet.  Drink plenty of fluids but you should avoid alcoholic beverages for 24 hours.  ACTIVITY:   You should plan to take it easy for the rest of today and you should NOT DRIVE or use heavy machinery until tomorrow (because of the sedation medicines used during the test).    FOLLOW UP: Our staff will call the number listed on your records 48-72 hours following your procedure to check on you and address any questions or concerns that you may have regarding the information given to you following your procedure. If we do not reach you, we will leave a message.  We will attempt to reach you two times.  During this call, we will ask if you have developed any symptoms of COVID 19. If you develop any symptoms (ie: fever, flu-like symptoms, shortness of breath, cough etc.) before then, please call [phone].  If you test positive for Covid 19 in the 2 weeks post procedure, please call and report this information to [place].    If any biopsies were taken you will be contacted by phone or by letter within the next 1-3 weeks.  Please call us at [phone] if you have not heard about the biopsies in 3 weeks.    SIGNATURES/CONFIDENTIALITY: You and/or your care partner have signed paperwork which will be entered into your electronic medical record.  These signatures attest to the fact that that the information above on your After Visit Summary has been reviewed and is understood.  Full responsibility of the confidentiality of this discharge information lies with you and/or your  care-partner.  

## 2021-01-26 NOTE — Progress Notes (Signed)
Pt's states no medical or surgical changes since previsit or office visit. VS assessed by C.W 

## 2021-01-26 NOTE — Op Note (Signed)
[facility] Patient Name: Brandon Giles Procedure Date: 01/26/2021 8:37 AM MRN: [id number] Endoscopist: Mallie Mussel L. Loletha Carrow , MD Age: 60 Referring MD:  Date of Birth: 11-21-1960 Gender: Male Account #: [id number] Procedure:                Colonoscopy Indications:              Surveillance: Personal history of adenomatous                            polyps on last colonoscopy > 5 years ago                           < 72mm left colon TA in 2015 Medicines:                Monitored Anesthesia Care Procedure:                Pre-Anesthesia Assessment:                           - Prior to the procedure, a History and Physical                            was performed, and patient medications and                            allergies were reviewed. The patient's tolerance of                            previous anesthesia was also reviewed. The risks                            and benefits of the procedure and the sedation                            options and risks were discussed with the patient.                            All questions were answered, and informed consent                            was obtained. Prior Anticoagulants: The patient has                            taken no previous anticoagulant or antiplatelet                            agents. ASA Grade Assessment: II - A patient with                            mild systemic disease. After reviewing the risks                            and benefits, the patient was deemed in  satisfactory condition to undergo the procedure.                           After obtaining informed consent, the colonoscope                            was passed under direct vision. Throughout the                            procedure, the patient's blood pressure, pulse, and                            oxygen saturations were monitored continuously. The                            CF [id number] #[id number] was introduced through  the anus                            and advanced to the the cecum, identified by                            appendiceal orifice and ileocecal valve. The                            colonoscopy was performed without difficulty. The                            patient tolerated the procedure well. The quality                            of the bowel preparation was excellent. The                            ileocecal valve, appendiceal orifice, and rectum                            were photographed. Scope In: 8:49:47 AM Scope Out: 9:02:21 AM Scope Withdrawal Time: 0 hours 11 minutes 6 seconds  Total Procedure Duration: 0 hours 12 minutes 34 seconds  Findings:                 The perianal and digital rectal examinations were                            normal.                           Multiple small-mouthed diverticula were found in                            the left colon.                           There is no endoscopic evidence of polyps in the  entire colon.                           The exam was otherwise without abnormality on                            direct and retroflexion views. Complications:            No immediate complications. Estimated Blood Loss:     Estimated blood loss: none. Impression:               - Diverticulosis in the left colon.                           - The examination was otherwise normal on direct                            and retroflexion views.                           - No specimens collected. Recommendation:           - Patient has a contact number available for                            emergencies. The signs and symptoms of potential                            delayed complications were discussed with the                            patient. Return to normal activities tomorrow.                            Written discharge instructions were provided to the                            patient.                           -  Resume previous diet.                           - Continue present medications.                           - Repeat colonoscopy in 10 years for screening                            purposes. Greyson Riccardi L. Loletha Carrow, MD 01/26/2021 9:07:46 AM This report has been signed electronically.

## 2021-01-26 NOTE — Progress Notes (Signed)
To PACU, VSS. Report to Rn.tb 

## 2021-01-26 NOTE — Progress Notes (Signed)
History and Physical:  This patient presents for endoscopic testing for: Encounter Diagnosis  Name Primary?   Personal history of colonic polyps Yes    Patient without complaints today. TA last colon 2015    Past Medical History: Past Medical History:  Diagnosis Date   Allergy    seasonal   HOH (hard of hearing)    left ear   Hypertension    PONV (postoperative nausea and vomiting)    after hernia repair   Shortness of breath    with exercise-states 'out of shape'   Sleep apnea    CPAP     Past Surgical History: Past Surgical History:  Procedure Laterality Date   COLONOSCOPY  07/15/2013   Dr.Brodie   GANGLION CYST EXCISION  12/25/2011   Procedure: REMOVAL GANGLION OF WRIST;  Surgeon: Magnus Sinning, MD;  Location: [facility];  Service: Orthopedics;  Laterality: Left;  excision of ganglion cyst of left wrist    HERNIA REPAIR  mid '90's   bilateral ing   KNEE ARTHROSCOPY  '99   left    NASAL SEPTOPLASTY W/ TURBINOPLASTY Bilateral 06/04/2013   Procedure: BILATERNAL NASAL SEPTOPLASTY WITH TURBINATE REDUCTION;  Surgeon: Izora Gala, MD;  Location: [facility];  Service: ENT;  Laterality: Bilateral;   SEPTOPLASTY     TONSILLECTOMY      Allergies: Allergies  Allergen Reactions   Hydrocodone-Acetaminophen Other (See Comments)    Doesn't like how it makes him feel-dizzy,tachycardic    Outpatient Meds: Current Outpatient Medications  Medication Sig Dispense Refill   alfuzosin (UROXATRAL) 10 MG 24 hr tablet Take 1 tablet (10 mg total) by mouth daily with breakfast. 90 tablet 3   amLODipine (NORVASC) 5 MG tablet Take 5 mg by mouth daily.     cetirizine (ZYRTEC) 10 MG tablet Take 10 mg by mouth daily.     levothyroxine (SYNTHROID) 50 MCG tablet Take 50 mcg by mouth daily.     Omega-3 Fatty Acids (OMEGA 3 PO) Take by mouth.     omeprazole (PRILOSEC) 40 MG capsule Take 40 mg by mouth daily.     pravastatin (PRAVACHOL) 80 MG tablet Take  40 mg by mouth daily.     telmisartan (MICARDIS) 40 MG tablet Take 20 mg by mouth daily.     Current Facility-Administered Medications  Medication Dose Route Frequency Provider Last Rate Last Admin   0.9 %  sodium chloride infusion  500 mL Intravenous Once Nelida Meuse III, MD          ___________________________________________________________________ Objective   Exam:  BP 129/76   Pulse 76   Temp 98 F (36.7 C) (Skin)   Ht 5\' 8"  (1.727 m)   Wt 208 lb (94.3 kg)   SpO2 98%   BMI 31.63 kg/m   CV: RRR without murmur, S1/S2 Resp: clear to auscultation bilaterally, normal RR and effort noted GI: soft, no tenderness, with active bowel sounds.   Assessment: Encounter Diagnosis  Name Primary?   Personal history of colonic polyps Yes     Plan: Colonoscopy  The benefits and risks of the planned procedure were described in detail with the patient or (when appropriate) their health care proxy.  Risks were outlined as including, but not limited to, bleeding, infection, perforation, adverse medication reaction leading to cardiac or pulmonary decompensation, pancreatitis (if ERCP).  The limitation of incomplete mucosal visualization was also discussed.  No guarantees or warranties were given.    The patient is appropriate for  an endoscopic procedure in the ambulatory setting.   - Wilfrid Lund, MD

## 2021-01-30 ENCOUNTER — Telehealth: Payer: Self-pay | Admitting: *Deleted

## 2021-01-30 NOTE — Telephone Encounter (Signed)
  Follow up Call-  Call back number 01/26/2021  Post procedure Call Back phone  # [phone]  Permission to leave phone message Yes  Some recent data might be hidden     Patient questions:  Do you have a fever, pain , or abdominal swelling? No. Pain Score  0 *  Have you tolerated food without any problems? Yes.    Have you been able to return to your normal activities? Yes.    Do you have any questions about your discharge instructions: Diet   No. Medications  No. Follow up visit  No.  Do you have questions or concerns about your Care? No.  Actions: * If pain score is 4 or above: No action needed, pain <4.  Have you developed a fever since your procedure? no  2.   Have you had an respiratory symptoms (SOB or cough) since your procedure? no  3.   Have you tested positive for COVID 19 since your procedure no  4.   Have you had any family members/close contacts diagnosed with the COVID 19 since your procedure?  no   If yes to any of these questions please route to Joylene John, RN and Joella Prince, RN

## 2021-03-26 ENCOUNTER — Other Ambulatory Visit: Payer: Self-pay

## 2021-03-26 DIAGNOSIS — N401 Enlarged prostate with lower urinary tract symptoms: Secondary | ICD-10-CM

## 2021-03-26 MED ORDER — OXYBUTYNIN CHLORIDE 5 MG PO TABS
5.0000 mg | ORAL_TABLET | Freq: Every evening | ORAL | 3 refills | Status: DC
Start: 2021-03-26 — End: 2021-05-16

## 2021-04-05 ENCOUNTER — Other Ambulatory Visit: Payer: Self-pay

## 2021-04-05 ENCOUNTER — Other Ambulatory Visit: Payer: 59

## 2021-04-05 DIAGNOSIS — N5201 Erectile dysfunction due to arterial insufficiency: Secondary | ICD-10-CM

## 2021-04-05 DIAGNOSIS — R972 Elevated prostate specific antigen [PSA]: Secondary | ICD-10-CM

## 2021-04-06 LAB — PSA, TOTAL AND FREE
PSA, Free Pct: 20.5 %
PSA, Free: 1.21 ng/mL
Prostate Specific Ag, Serum: 5.9 ng/mL — ABNORMAL HIGH (ref 0.0–4.0)

## 2021-04-06 LAB — TESTOSTERONE: Testosterone: 340 ng/dL (ref 264–916)

## 2021-04-12 ENCOUNTER — Encounter: Payer: Self-pay | Admitting: Urology

## 2021-04-12 ENCOUNTER — Other Ambulatory Visit: Payer: Self-pay

## 2021-04-12 ENCOUNTER — Ambulatory Visit: Payer: 59 | Admitting: Urology

## 2021-04-12 VITALS — BP 172/79 | HR 75

## 2021-04-12 DIAGNOSIS — N5201 Erectile dysfunction due to arterial insufficiency: Secondary | ICD-10-CM

## 2021-04-12 DIAGNOSIS — R972 Elevated prostate specific antigen [PSA]: Secondary | ICD-10-CM

## 2021-04-12 DIAGNOSIS — R350 Frequency of micturition: Secondary | ICD-10-CM | POA: Diagnosis not present

## 2021-04-12 DIAGNOSIS — N138 Other obstructive and reflux uropathy: Secondary | ICD-10-CM

## 2021-04-12 DIAGNOSIS — N401 Enlarged prostate with lower urinary tract symptoms: Secondary | ICD-10-CM | POA: Diagnosis not present

## 2021-04-12 DIAGNOSIS — E291 Testicular hypofunction: Secondary | ICD-10-CM

## 2021-04-12 LAB — URINALYSIS, ROUTINE W REFLEX MICROSCOPIC
Bilirubin, UA: NEGATIVE
Glucose, UA: NEGATIVE
Ketones, UA: NEGATIVE
Leukocytes,UA: NEGATIVE
Nitrite, UA: NEGATIVE
Protein,UA: NEGATIVE
RBC, UA: NEGATIVE
Specific Gravity, UA: 1.015 (ref 1.005–1.030)
Urobilinogen, Ur: 0.2 mg/dL (ref 0.2–1.0)
pH, UA: 7 (ref 5.0–7.5)

## 2021-04-12 MED ORDER — TADALAFIL 5 MG PO TABS: 5.0000 mg | ORAL_TABLET | Freq: Every day | ORAL | 11 refills | Status: DC | PRN

## 2021-04-12 NOTE — Progress Notes (Signed)
Urological Symptom Review  Patient is experiencing the following symptoms: Frequent urination Hard to postpone urination Get up at night to urinate Stream starts and stops Trouble starting stream Have to strain to urinate Weak stream   Review of Systems  Gastrointestinal (upper)  : Negative for upper GI symptoms  Gastrointestinal (lower) : Negative for lower GI symptoms  Constitutional : Negative for symptoms  Skin: Negative for skin symptoms  Eyes: Negative for eye symptoms  Ear/Nose/Throat : Negative for Ear/Nose/Throat symptoms  Hematologic/Lymphatic: Negative for Hematologic/Lymphatic symptoms  Cardiovascular : Negative for cardiovascular symptoms  Respiratory : Negative for respiratory symptoms  Endocrine: Negative for endocrine symptoms  Musculoskeletal: Negative for musculoskeletal symptoms  Neurological: Negative for neurological symptoms  Psychologic: Negative for psychiatric symptoms

## 2021-04-12 NOTE — Progress Notes (Signed)
Patient ID: Brandon Giles, male   DOB: 01/14/1961, 60 y.o.   MRN: [id number]  Subjective:  1. Urinary frequency   2. BPH with urinary obstruction   3. Elevated prostate specific antigen (PSA)   4. Erectile dysfunction due to arterial insufficiency   5. Primary hypogonadism in male     Brandon Giles returns today in f/u for his history of an elevated PSA of 4.4  with a negative biopsy in 9/20.   He has BPH with BOO and has been on tamsulosin.  He has ED and uses sildenafil.  He has hypogonadism and has been on clomiphene.    05/25/20:  His PSA was up to 7.6 in 03/20/20.   His testosterone is normal at 383 with a normal Hgb.  He has moderate to severe LUTS with an IPSS of 21.  He primarily complains of urgency and frequency.  He has nocturia 3-4x.  He drinks a lot of fluid.  He has 2 cups of coffee qam. His PVR today is low.  He had inadequate volume for a flow rate.   He has no sensation of ejaculation with the tamsulosin.   He has no dysuria or hematuria.      3/10/22[name] returns today in f/u.  His PSA was up to 7.9 in 1/22 and a prostate MRI was ordered.  He was switched to alfuzosin at his last visit.  He feels it is working better but he has fewer bowel movements and he fells bloated and has some back pain in the right low back.  His IPSS is 20.   The prostate MRI showed no high risk lesions but he had PIRADs 2 changes in the PZ that were non-specific and could be associated with prostatitis but his UA's have been clear.   He continues to use sildenafil but is off of clomid.   6/16/22[name] returns today in f/u.  His PSA is down slightly to 7.6 with a 21% f/t ratio.  He remains on alfuzosin but is off of clomiphene.  He has some difficulty voiding at night and has nocturia x 4.  His IPSS is 23.  He has some frequency.  He has intermittency at night with a return within 1 hour of voiding.  He has had no hematuria or dysuria.  His prostate volume in 9/20 was 43ml.   12/15/22[name] returns today in  f/u.  His PSA is down to 5.9 with a 20.5% f/t ratio.  His testosterone is 340.  He remains off of Clomid.  He remains on the alfuzosin and oxybutynin IR 5mg  at bedtime.  He has persistent moderate to severe LUTS with an IPSS of 25.  He has a sensation of incomplete emptying, frequency, urgency, nocturia x 4 and a reduced stream  He has gained about 10 lbs.   He has ED and has sildenafil but he hasn't been active.  His prostate volume is 47 ml on the MRI from 2/22.  He has had some issues with small frequent loose stools and he may have a BM 5-10x daily.   He has had some mild IBS.   He has not had COVID.    IPSS     Row Name 04/12/21 1500         International Prostate Symptom Score   How often have you had the sensation of not emptying your bladder? More than half the time     How often have you had to urinate less than every  two hours? More than half the time     How often have you found you stopped and started again several times when you urinated? About half the time     How often have you found it difficult to postpone urination? About half the time     How often have you had a weak urinary stream? More than half the time     How often have you had to strain to start urination? About half the time     How many times did you typically get up at night to urinate? 4 Times     Total IPSS Score 25       Quality of Life due to urinary symptoms   If you were to spend the rest of your life with your urinary condition just the way it is now how would you feel about that? Unhappy               ROS:  ROS:  A complete review of systems was performed.  All systems are negative except for pertinent findings as noted.   ROS  Allergies  Allergen Reactions   Hydrocodone-Acetaminophen Other (See Comments)    Doesn't like how it makes him feel-dizzy,tachycardic    Outpatient Encounter Medications as of 04/12/2021  Medication Sig   alfuzosin (UROXATRAL) 10 MG 24 hr tablet Take 1 tablet  (10 mg total) by mouth daily with breakfast.   amLODipine (NORVASC) 5 MG tablet Take 5 mg by mouth daily.   cetirizine (ZYRTEC) 10 MG tablet Take 10 mg by mouth daily.   levothyroxine (SYNTHROID) 50 MCG tablet Take 50 mcg by mouth daily.   loratadine (CLARITIN) 10 MG tablet Take 10 mg by mouth daily.   omeprazole (PRILOSEC) 40 MG capsule Take 40 mg by mouth daily.   oxybutynin (DITROPAN) 5 MG tablet Take 1 tablet (5 mg total) by mouth at bedtime.   pravastatin (PRAVACHOL) 80 MG tablet Take 40 mg by mouth daily.   tadalafil (CIALIS) 5 MG tablet Take 1 tablet (5 mg total) by mouth daily as needed for erectile dysfunction.   telmisartan (MICARDIS) 40 MG tablet Take 20 mg by mouth daily.   [DISCONTINUED] Omega-3 Fatty Acids (OMEGA 3 PO) Take by mouth.   No facility-administered encounter medications on file as of 04/12/2021.    Past Medical History:  Diagnosis Date   Allergy    seasonal   HOH (hard of hearing)    left ear   Hypertension    PONV (postoperative nausea and vomiting)    after hernia repair   Shortness of breath    with exercise-states 'out of shape'   Sleep apnea    CPAP    Past Surgical History:  Procedure Laterality Date   COLONOSCOPY  07/15/2013   Dr.Brodie   GANGLION CYST EXCISION  12/25/2011   Procedure: REMOVAL GANGLION OF WRIST;  Surgeon: Magnus Sinning, MD;  Location: [place];  Service: Orthopedics;  Laterality: Left;  excision of ganglion cyst of left wrist    HERNIA REPAIR  mid '90's   bilateral ing   KNEE ARTHROSCOPY  '99   left    NASAL SEPTOPLASTY W/ TURBINOPLASTY Bilateral 06/04/2013   Procedure: BILATERNAL NASAL SEPTOPLASTY WITH TURBINATE REDUCTION;  Surgeon: Izora Gala, MD;  Location: [facility];  Service: ENT;  Laterality: Bilateral;   SEPTOPLASTY     TONSILLECTOMY      Social History   Socioeconomic History   Marital status: Married  Spouse name: Not on file   Number of children: Not on file   Years  of education: Not on file   Highest education level: Not on file  Occupational History   Not on file  Tobacco Use   Smoking status: Never   Smokeless tobacco: Never  Vaping Use   Vaping Use: Never used  Substance and Sexual Activity   Alcohol use: No   Drug use: No   Sexual activity: Not on file  Other Topics Concern   Not on file  Social History Narrative   Not on file   Social Determinants of Health   Financial Resource Strain: Not on file  Food Insecurity: Not on file  Transportation Needs: Not on file  Physical Activity: Not on file  Stress: Not on file  Social Connections: Not on file  Intimate Partner Violence: Not on file    Family History  Problem Relation Age of Onset   Heart disease Mother    Lung cancer Mother    Stroke Mother    Diabetes Father    Colon cancer Neg Hx    Esophageal cancer Neg Hx    Rectal cancer Neg Hx    Stomach cancer Neg Hx    Colon polyps Neg Hx        Objective: Vitals:   04/12/21 1510  BP: (!) 172/79  Pulse: 75     Physical Exam  Lab Results:  PSA No results found for: PSA Testosterone  Date Value Ref Range Status  04/05/2021 340 264 - 916 ng/dL Final    Comment:    Adult male reference interval is based on a population of healthy nonobese males (BMI <30) between 90 and 57 years old. [place], [place] [phone]. PMID: 78242353.   03/20/2020 383 264 - 916 ng/dL Final    Comment:    Adult male reference interval is based on a population of healthy nonobese males (BMI <30) between 35 and 32 years old. [place], [place] [phone]. PMID: 95093267.    Lab Results  Component Value Date   PSA1 5.9 (H) 04/05/2021   Lab Results  Component Value Date   HCT 46.5 03/20/2020   Lab Results  Component Value Date   HGB 15.7 03/20/2020    UA is unremarkable today.   Studies/Results:     Assessment & Plan: Elevated PSA.  His PSA is back down slightly and the f/t ratio is borderline.    I am going to have him return with a PSA in 6 months.    BPH with BOO with OAB.   He reports some improvement with the alfuzosin but he still has nocturia.      ED. Contine Sildenafil     Meds ordered this encounter  Medications   tadalafil (CIALIS) 5 MG tablet    Sig: Take 1 tablet (5 mg total) by mouth daily as needed for erectile dysfunction.    Dispense:  30 tablet    Refill:  11      Orders Placed This Encounter  Procedures   Urinalysis, Routine w reflex microscopic      Return in about 6 weeks (around 05/24/2021) for flow rate and cystoscopy on return.  .   CC: Redmond School, MD      Irine Seal 04/12/2021 Patient ID: Inocencio Homes, male   DOB: [birth year], 60 y.o.   MRN: [id number]

## 2021-04-18 ENCOUNTER — Telehealth: Payer: Self-pay

## 2021-04-18 NOTE — Telephone Encounter (Signed)
Pt called to ask if he should continue medications, he should continue medications as prescribed. He was understanding

## 2021-05-16 ENCOUNTER — Other Ambulatory Visit: Payer: 59

## 2021-05-16 ENCOUNTER — Ambulatory Visit: Payer: 59 | Admitting: Nurse Practitioner

## 2021-05-16 ENCOUNTER — Encounter: Payer: Self-pay | Admitting: Nurse Practitioner

## 2021-05-16 VITALS — BP 142/80 | HR 65 | Ht 68.0 in | Wt 212.2 lb

## 2021-05-16 DIAGNOSIS — R197 Diarrhea, unspecified: Secondary | ICD-10-CM

## 2021-05-16 NOTE — Patient Instructions (Signed)
Your provider has requested that you go to the basement level for lab work before leaving today. Press "B" on the elevator. The lab is located at the first door on the left as you exit the elevator.   If you are age 59 or older, your body mass index should be between 23-30. Your Body mass index is 32.27 kg/m. If this is out of the aforementioned range listed, please consider follow up with your Primary Care Provider.  If you are age 5 or younger, your body mass index should be between 19-25. Your Body mass index is 32.27 kg/m. If this is out of the aformentioned range listed, please consider follow up with your Primary Care Provider.   ________________________________________________________  The [facility] providers would like to encourage you to use [facility] to communicate with providers for non-urgent requests or questions.  Due to long hold times on the telephone, sending your provider a message by [facility] may be a faster and more efficient way to get a response.  Please allow 48 business hours for a response.  Please remember that this is for non-urgent requests.  _______________________________________________________

## 2021-05-16 NOTE — Progress Notes (Signed)
ASSESSMENT AND PLAN    # 61 yo male with 4 month history of unexplained diarrhea / bloating and excessive gas since colonoscopy September 2022. Bowel preps can result in short term changes in the bowel microbiome but this generally resolves within a couple of weeks.  Stool studies negative including C-difficile Toxin Gene NAA  and stool culture (salmonella , campylobacter, and E.coli). Also no fecal WBCs. Polyp surveillance colonoscopy in September 2022 remarkable only for diverticulosis ( wasn't having diarrhea at the time). No associated weight loss ( up a few pounds) --Check tTg, IgG --Lomotil helps with frequency of BMs but not stool consistency nor the bloating / flatus --Suspect above will be negative and if so then will consider treating empirically for SIBO.  --Query something like giardiasis as he mentions that his wife is now developing some of the same bowel changes. He cannot correlate bowel changes to any dietary or medication changes. --Has hypothyroidism, on Synthroid.  TSH monitored by PCP.  # GERD, symptoms controlled with Omeprazole.   HISTORY OF PRESENT ILLNESS    Chief Complaint : diarrhea  Brandon Giles is a 61 y.o. male known to Dr. Loletha Carrow with a past medical history of sleep apnea, colon polyps, diverticulosis.  Additional medical history as listed in [place] .   Patient was last seen at time of his colonoscopy September 2022. He is here with bowel changes since the colonoscopy. Prior to colonoscopy he would have about 3 formed BMs a day. Since the colonoscopy he has been having mushy to loose BMs with excessive flatus / bloating up to 10 times a day. Stool has peculariar odor which is hard to describe but not particularly foul.   BMs are generally post-prandial but sometimes having nocturnal ones. He is intolerant to dairy so avoids it. He asks about possibility of celiac disease. He cannot attribute bowel changes to any medication changes.  Following colonoscopy he  did have antibiotics for cold symptoms but seems diarrhea had already started. Additionally he had COVID around Christmas but again bowel changes were already present.  He has used Kaopectate which helps but doesn't alleviate the symptoms. Interestingly his wife who has never had more than one BM a day is now having 3-5 liquid to mushy stools a day with the same odor. He and wife have not changed their diet. They don't take supplements. They have community well water ( treated). He has lived in the same home for 14 years.   Saw PCP last week. C-difficile Toxin Gene NAA as negative. Stool culture  negative for salmonella , campylobacter, and E.coli. Also no fecal WBCs. PCP started him on Lomotil which has reduced frequency to 5-7 times a day but consistency is the same as is the peculariar odor.   No flowsheet data found.  CBC Latest Ref Rng & Units 03/20/2020 06/04/2013 12/25/2011  Hemoglobin 13.0 - 17.7 g/dL 15.7 14.7 15.0  Hematocrit 37.5 - 51.0 % 46.5 - 44.0      PREVIOUS ENDOSCOPIC EVALUATIONS / PERTINENT STUDIES:    Current Medications, Allergies, Past Medical History, Past Surgical History, Family History and Social History were reviewed in [employer] record.     Current Outpatient Medications  Medication Sig Dispense Refill   alfuzosin (UROXATRAL) 10 MG 24 hr tablet Take 1 tablet (10 mg total) by mouth daily with breakfast. 90 tablet 3   amLODipine (NORVASC) 5 MG tablet Take 5 mg by mouth daily.     diphenoxylate-atropine (LOMOTIL)  2.5-0.025 MG tablet Take 2 tablets by mouth 4 (four) times daily.     levothyroxine (SYNTHROID) 50 MCG tablet Take 50 mcg by mouth daily.     loratadine (CLARITIN) 10 MG tablet Take 10 mg by mouth daily.     omeprazole (PRILOSEC) 40 MG capsule Take 40 mg by mouth daily.     pravastatin (PRAVACHOL) 80 MG tablet Take 40 mg by mouth daily.     tadalafil (CIALIS) 5 MG tablet Take 1 tablet (5 mg total) by mouth daily as needed for  erectile dysfunction. 30 tablet 11   telmisartan (MICARDIS) 40 MG tablet Take 20 mg by mouth daily.     No current facility-administered medications for this visit.    Review of Systems: No chest pain. No shortness of breath. No urinary complaints.   PHYSICAL EXAM :    Wt Readings from Last 3 Encounters:  05/16/21 212 lb 4 oz (96.3 kg)  01/26/21 208 lb (94.3 kg)  01/12/21 208 lb (94.3 kg)    BP (!) 142/80    Pulse 65    Ht 5\' 8"  (1.727 m)    Wt 212 lb 4 oz (96.3 kg)    SpO2 97%    BMI 32.27 kg/m  Constitutional:  Generally well appearing male in no acute distress. Psychiatric: Pleasant. Normal mood and affect. Behavior is normal. EENT: Pupils normal.  Conjunctivae are normal. No scleral icterus. Neck supple.  Cardiovascular: Normal rate, regular rhythm. No edema Pulmonary/chest: Effort normal and breath sounds normal. No wheezing, rales or rhonchi. Abdominal: Soft, nondistended, nontender. Bowel sounds active throughout. There are no masses palpable. No hepatomegaly. Neurological: Alert and oriented to person place and time. Skin: Skin is warm and dry. No rashes noted.  Tye Savoy, NP  05/16/2021, 9:22 AM  Cc:  Redmond School, MD

## 2021-05-17 LAB — IGA: Immunoglobulin A: 222 mg/dL (ref 47–310)

## 2021-05-17 LAB — TISSUE TRANSGLUTAMINASE ABS,IGG,IGA
(tTG) Ab, IgA: <1 U/mL
(tTG) Ab, IgG: <1 U/mL

## 2021-05-17 NOTE — Progress Notes (Signed)
____________________________________________________________  Attending physician addendum:  Thank you for sending this case to me. I have reviewed the entire note and agree with the plan.  As it sounds like his recent stool studies did not check for Giardia or Entamoeba, then he should have specific ova and parasite stool testing.  The symptoms are not expected to occur for months after routine colonoscopy.  Wilfrid Lund, MD  ____________________________________________________________

## 2021-05-18 ENCOUNTER — Other Ambulatory Visit: Payer: Self-pay

## 2021-05-18 DIAGNOSIS — R197 Diarrhea, unspecified: Secondary | ICD-10-CM

## 2021-05-22 ENCOUNTER — Other Ambulatory Visit: Payer: 59

## 2021-05-22 DIAGNOSIS — R197 Diarrhea, unspecified: Secondary | ICD-10-CM

## 2021-05-24 LAB — OVA AND PARASITE EXAMINATION
CONCENTRATE RESULT:: NONE SEEN
MICRO NUMBER:: 12911719
SPECIMEN QUALITY:: ADEQUATE
TRICHROME RESULT:: NONE SEEN

## 2021-05-31 ENCOUNTER — Telehealth: Payer: Self-pay | Admitting: Nurse Practitioner

## 2021-05-31 NOTE — Telephone Encounter (Signed)
Inbound call from patient requesting a call back to discuss results. States he is still experiencing the problems. Best contact number [phone]

## 2021-06-01 NOTE — Telephone Encounter (Signed)
Spoke with the patient and his spouse, Maudie Mercury, on speaker phone. Patient reports worsening symptoms. He is very gaseous, with bloating and urgency. This includes the middle of the night. He does not have any formed stools. Consistency is ranging from diarrhea to mushy stools. Bowel movements are 5 to 10 times a day. He notes a  "peculiar odor" of the stools, stating "does not smell like regular stool."  He does not notice any blood or mucous in his stools. The color is from yellow to brownish. He has begun to have an uncomfortable feeling in his lower chest. He wonders if it due to his bloating. He has begun having indigestion and belching. Confirms he is taking daily Omeprazole. He did try Dexilant for a couple of days and feels his symptoms were less.  He and his spouse have a large dog that sleeps with them. The dog does not have any issues noticed with his bowel movements.

## 2021-06-04 MED ORDER — METRONIDAZOLE 500 MG PO TABS: 500.0000 mg | ORAL_TABLET | Freq: Two times a day (BID) | ORAL | 0 refills | Status: AC

## 2021-06-04 NOTE — Telephone Encounter (Signed)
I spoke with the pt and advised him of Paula's recommendations.  He agrees to a course of flagyl.  I have sent that to his pharmacy.  He will call or message when he has completed the course to give an update. He will avoid ETOH while taking and for 3 days after.

## 2021-06-07 ENCOUNTER — Other Ambulatory Visit: Payer: 59 | Admitting: Urology

## 2021-06-14 ENCOUNTER — Telehealth: Payer: Self-pay | Admitting: Nurse Practitioner

## 2021-06-14 NOTE — Telephone Encounter (Signed)
Inbound call from patient stated that he has stopped taking Flagyl and wanted to inform Nevin Bloodgood on what's been going on since being off of the medication. Please advise.

## 2021-06-15 NOTE — Telephone Encounter (Signed)
Patient seen 04/1821 for complaints of diarrhea. Stools studies were negative. He was started on Flagyl for presumed infectious diarrhea. Patient has finished the Flagyl. He reports decrease in the number of stools from "at least 10 a day to 5 or 6 a day" "continue to have diarrhea and bloating though." States he is beginning to feel desperate. Asks if a bowel purge or a liquid diet would help. "I will try anything." Please advise.

## 2021-06-21 ENCOUNTER — Other Ambulatory Visit: Payer: 59 | Admitting: Urology

## 2021-06-21 NOTE — Telephone Encounter (Signed)
Patient scheduled for follow up with Dr Loletha Carrow.

## 2021-06-26 ENCOUNTER — Ambulatory Visit: Payer: 59 | Admitting: Gastroenterology

## 2021-06-26 ENCOUNTER — Other Ambulatory Visit (INDEPENDENT_AMBULATORY_CARE_PROVIDER_SITE_OTHER): Payer: 59

## 2021-06-26 ENCOUNTER — Encounter: Payer: Self-pay | Admitting: Gastroenterology

## 2021-06-26 VITALS — BP 156/90 | HR 68 | Ht 68.0 in | Wt 210.4 lb

## 2021-06-26 DIAGNOSIS — K529 Noninfective gastroenteritis and colitis, unspecified: Secondary | ICD-10-CM

## 2021-06-26 LAB — CBC WITH DIFFERENTIAL/PLATELET
Basophils Absolute: 0.1 10*3/uL (ref 0.0–0.1)
Basophils Relative: 0.9 % (ref 0.0–3.0)
Eosinophils Absolute: 0.2 10*3/uL (ref 0.0–0.7)
Eosinophils Relative: 2.4 % (ref 0.0–5.0)
HCT: 45.6 % (ref 39.0–52.0)
Hemoglobin: 15.6 g/dL (ref 13.0–17.0)
Lymphocytes Relative: 21.3 % (ref 12.0–46.0)
Lymphs Abs: 2.1 10*3/uL (ref 0.7–4.0)
MCHC: 34.2 g/dL (ref 30.0–36.0)
MCV: 85.6 fl (ref 78.0–100.0)
Monocytes Absolute: 0.8 10*3/uL (ref 0.1–1.0)
Monocytes Relative: 7.8 % (ref 3.0–12.0)
Neutro Abs: 6.6 10*3/uL (ref 1.4–7.7)
Neutrophils Relative %: 67.6 % (ref 43.0–77.0)
Platelets: 321 10*3/uL (ref 150.0–400.0)
RBC: 5.33 Mil/uL (ref 4.22–5.81)
RDW: 13 % (ref 11.5–15.5)
WBC: 9.8 10*3/uL (ref 4.0–10.5)

## 2021-06-26 LAB — COMPREHENSIVE METABOLIC PANEL
ALT: 25 U/L (ref 0–53)
AST: 12 U/L (ref 0–37)
Albumin: 4.6 g/dL (ref 3.5–5.2)
Alkaline Phosphatase: 64 U/L (ref 39–117)
BUN: 18 mg/dL (ref 6–23)
CO2: 24 mEq/L (ref 19–32)
Calcium: 9.3 mg/dL (ref 8.4–10.5)
Chloride: 104 mEq/L (ref 96–112)
Creatinine, Ser: 1.2 mg/dL (ref 0.40–1.50)
GFR: 66.45 mL/min (ref >60.00)
Glucose, Bld: 116 mg/dL — ABNORMAL HIGH (ref 70–99)
Potassium: 3.5 mEq/L (ref 3.5–5.1)
Sodium: 139 mEq/L (ref 135–145)
Total Bilirubin: 0.5 mg/dL (ref 0.2–1.2)
Total Protein: 7.1 g/dL (ref 6.0–8.3)

## 2021-06-26 LAB — B12 AND FOLATE PANEL
Folate: 15.6 ng/mL (ref >5.9)
Vitamin B-12: 238 pg/mL (ref 211–911)

## 2021-06-26 LAB — IBC + FERRITIN
Ferritin: 33.6 ng/mL (ref 22.0–322.0)
Iron: 82 ug/dL (ref 42–165)
Saturation Ratios: 23.5 % (ref 20.0–50.0)
TIBC: 348.6 ug/dL (ref 250.0–450.0)
Transferrin: 249 mg/dL (ref 212.0–360.0)

## 2021-06-26 LAB — T4, FREE: Free T4: 1.15 ng/dL (ref 0.60–1.60)

## 2021-06-26 LAB — TSH: TSH: 3.38 u[IU]/mL (ref 0.35–5.50)

## 2021-06-26 MED ORDER — DIPHENOXYLATE-ATROPINE 2.5-0.025 MG PO TABS
1.0000 | ORAL_TABLET | Freq: Four times a day (QID) | ORAL | 0 refills | Status: DC | PRN
Start: 2021-06-26 — End: 2021-07-26

## 2021-06-26 NOTE — Progress Notes (Signed)
[facility] Progress Note  Chief Complaint: Chronic diarrhea  Subjective  History: Brandon Giles was seen by our NP January 18 for diarrhea that been occurring since September, beginning shortly after his surveillance colonoscopy with me at that time. Stool studies negative for C. difficile, ova parasites and other pathogens, negative for WBCs.  Negative celiac antibody He was prescribed an empiric 10-day course of metronidazole out of concern for possible infection not detected on stool testing.  He messaged the office last week saying had decreased the frequency of the stool somewhat but he still had 5-6 semiformed stools per day with an unusual odor.  Brandon Giles is really no better since he was last seen here.  10-12 mostly watery BMs both day and nighttime, waking him from sleep several nights a week.  If there is any substance of the stool, it is "mushy".  He has urgency before BMs, bloating and gas, all of which is quite distressing.  It is causing perianal irritation, as he has already had 4 BMs before his visit this morning.  Taking antidiarrheal medicine such as Imodium or Lomotil decreases the frequency somewhat, but increases the bloating and discomfort.  He had no new medicines, antibiotics or travel prior to the onset of symptoms. He may have lost 2 pounds in the last few months based on his home scale.  Denies rectal bleeding, rash, hives. ROS: Cardiovascular:  no chest pain Respiratory: no dyspnea He is always thirsty and concerned he may be at risk for dehydration. The patient's Past Medical, Family and Social History were reviewed and are on file in the EMR.  Objective:  Med list reviewed  Current Outpatient Medications:    amLODipine (NORVASC) 5 MG tablet, Take 5 mg by mouth daily., Disp: , Rfl:    Bacillus Coagulans-Inulin (ALIGN PREBIOTIC-PROBIOTIC) 5-1.25 MG-GM CHEW, Chew 1 Dose by mouth daily at 12 noon., Disp: , Rfl:    diphenoxylate-atropine (LOMOTIL) 2.5-0.025 MG  tablet, Take 2 tablets by mouth 4 (four) times daily., Disp: , Rfl:    levothyroxine (SYNTHROID) 50 MCG tablet, Take 50 mcg by mouth daily., Disp: , Rfl:    loratadine (CLARITIN) 10 MG tablet, Take 10 mg by mouth daily., Disp: , Rfl:    Omega-3 Fatty Acids (FISH OIL) 500 MG CAPS, Take 1 capsule by mouth daily at 12 noon., Disp: , Rfl:    omeprazole (PRILOSEC) 40 MG capsule, Take 40 mg by mouth daily., Disp: , Rfl:    oxybutynin (DITROPAN) 5 MG tablet, Take 5 mg by mouth daily., Disp: , Rfl:    pravastatin (PRAVACHOL) 80 MG tablet, Take 40 mg by mouth daily., Disp: , Rfl:    tadalafil (CIALIS) 5 MG tablet, Take 1 tablet (5 mg total) by mouth daily as needed for erectile dysfunction., Disp: 30 tablet, Rfl: 11   telmisartan (MICARDIS) 40 MG tablet, Take 20 mg by mouth daily., Disp: , Rfl:   He has been taking the omeprazole daily for years, prescribed by primary care for heartburn. He has also been on telmisartan for years. Vital signs in last 24 hrs: Vitals:   06/26/21 0823  BP: (!) 156/90  Pulse: 68   Wt Readings from Last 3 Encounters:  06/26/21 210 lb 6.4 oz (95.4 kg)  05/16/21 212 lb 4 oz (96.3 kg)  01/26/21 208 lb (94.3 kg)    Note weight down 2 pounds from last month's visit. His wife was present for the entire visit. Physical Exam  Well-appearing, no muscle wasting HEENT:  sclera anicteric, oral mucosa moist without lesions Neck: supple, no thyromegaly, JVD or lymphadenopathy Cardiac: RRR without murmurs, S1S2 heard, no peripheral edema Pulm: clear to auscultation bilaterally, normal RR and effort noted Abdomen: soft, no tenderness, with active bowel sounds. No guarding or palpable hepatosplenomegaly. Skin; warm and dry, no jaundice or rash  Labs: Stool studies as noted above  ___________________________________________ Radiologic studies:   ____________________________________________ Other:   _____________________________________________ Assessment & Plan   Assessment: Encounter Diagnosis  Name Primary?   Chronic diarrhea Yes   Months of severe watery diarrhea, no cause on testing thus far. No weight loss or oily stool to suggest a malabsorptive condition, nor does he have clear risk factors for that.  No history of pancreatic disease.  Chronic stable medicines, negative for celiac antibodies.  ARB can uncommonly cause a sprue like condition, but again he does not have weight loss. Long-term PPI use, age or risk factors for microscopic colitis.  This sounds more like that condition or other colonic cause than a small bowel source. No discernible infections on testing, he also did not improve on empiric therapy with metronidazole in case there was some pathogen not detected on the stool testing or SIBO.   Plan: Labs today: CBC, CMP, TSH and free T4, iron studies and B12 (the latter two to see if any evidence of malabsorption  Colonoscopy this week.  He was agreeable after discussion of procedure and risks  The benefits and risks of the planned procedure were described in detail with the patient or (when appropriate) their health care proxy.  Risks were outlined as including, but not limited to, bleeding, infection, perforation, adverse medication reaction leading to cardiac or pulmonary decompensation, pancreatitis (if ERCP).  The limitation of incomplete mucosal visualization was also discussed.  No guarantees or warranties were given.  Lomotil prescription written, to be used after colonoscopy so as not to interfere with bowel prep.  Discontinue omeprazole and start OTC famotidine instead.  35 minutes were spent on this encounter (including chart review, history/exam, counseling/coordination of care, and documentation) > 50% of that time was spent on counseling and coordination of care.   Nelida Meuse III

## 2021-06-26 NOTE — Patient Instructions (Addendum)
If you are age 59 or older, your body mass index should be between 23-30. Your Body mass index is 31.99 kg/m. If this is out of the aforementioned range listed, please consider follow up with your Primary Care Provider.  If you are age 72 or younger, your body mass index should be between 19-25. Your Body mass index is 31.99 kg/m. If this is out of the aformentioned range listed, please consider follow up with your Primary Care Provider.   ________________________________________________________  The [facility] providers would like to encourage you to use [facility] to communicate with providers for non-urgent requests or questions.  Due to long hold times on the telephone, sending your provider a message by [facility] may be a faster and more efficient way to get a response.  Please allow 48 business hours for a response.  Please remember that this is for non-urgent requests.  _______________________________________________________  Dennis Bast have been scheduled for a colonoscopy. Please follow written instructions given to you at your visit today.  Please pick up your prep supplies at the pharmacy within the next 1-3 days. If you use inhalers (even only as needed), please bring them with you on the day of your procedure.  Your provider has requested that you go to the basement level for lab work before leaving today. Press "B" on the elevator. The lab is located at the first door on the left as you exit the elevator.  Due to recent changes in healthcare laws, you may see the results of your imaging and laboratory studies on MyChart before your provider has had a chance to review them.  We understand that in some cases there may be results that are confusing or concerning to you. Not all laboratory results come back in the same time frame and the provider may be waiting for multiple results in order to interpret others.  Please give [place] 48 hours in order for your provider to thoroughly review all the results  before contacting the office for clarification of your results.    It was a pleasure to see you today!  Thank you for trusting me with your gastrointestinal care!

## 2021-06-28 ENCOUNTER — Ambulatory Visit (AMBULATORY_SURGERY_CENTER): Payer: 59 | Admitting: Gastroenterology

## 2021-06-28 ENCOUNTER — Encounter: Payer: Self-pay | Admitting: Gastroenterology

## 2021-06-28 ENCOUNTER — Other Ambulatory Visit: Payer: Self-pay

## 2021-06-28 VITALS — BP 120/75 | HR 60 | Temp 97.8°F | Resp 10 | Ht 68.0 in | Wt 212.0 lb

## 2021-06-28 DIAGNOSIS — K5730 Diverticulosis of large intestine without perforation or abscess without bleeding: Secondary | ICD-10-CM | POA: Diagnosis not present

## 2021-06-28 DIAGNOSIS — K529 Noninfective gastroenteritis and colitis, unspecified: Secondary | ICD-10-CM | POA: Diagnosis not present

## 2021-06-28 DIAGNOSIS — D120 Benign neoplasm of cecum: Secondary | ICD-10-CM

## 2021-06-28 MED ORDER — SODIUM CHLORIDE 0.9 % IV SOLN: 500.0000 mL | Freq: Once | INTRAVENOUS | Status: DC

## 2021-06-28 NOTE — Progress Notes (Signed)
History and Physical: ? This patient presents for endoscopic testing for: ?Encounter Diagnosis  ?Name Primary?  ? Chronic diarrhea Yes  ? ? ?Clinical details in 06/26/21 office note ? ?ROS: ?Patient denies chest pain or shortness of breath ? ? ?Past Medical History: ?Past Medical History:  ?Diagnosis Date  ? Allergy   ? seasonal  ? 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 Hypertension   ? PONV (postoperative nausea and vomiting)   ? after hernia repair  ? Shortness of breath   ? with exercise-states 'out of shape'  ? Sleep apnea   ? CPAP  ? ? ? ?Past Surgical History: ?Past Surgical History:  ?Procedure Laterality Date  ? COLONOSCOPY  07/15/2013  ? Dr.Brodie  ? GANGLION CYST EXCISION  12/25/2011  ? Procedure: REMOVAL GANGLION OF WRIST;  Surgeon: Magnus Sinning, MD;  Location: [facility];  Service: Orthopedics;  Laterality: Left;  excision of ganglion cyst of left wrist   ? HERNIA REPAIR  mid '90's  ? bilateral ing  ? KNEE ARTHROSCOPY  '99  ? left   ? NASAL SEPTOPLASTY W/ TURBINOPLASTY Bilateral 06/04/2013  ? Procedure: BILATERNAL NASAL SEPTOPLASTY WITH TURBINATE REDUCTION;  Surgeon: Izora Gala, MD;  Location: [facility];  Service: ENT;  Laterality: Bilateral;  ? SEPTOPLASTY    ? TONSILLECTOMY    ? ? ?Allergies: ?Allergies  ?Allergen Reactions  ? Hydrocodone-Acetaminophen Other (See Comments)  ?  Doesn't like how it makes him feel-dizzy,tachycardic  ? ? ?Outpatient Meds: ?Current Outpatient Medications  ?Medication Sig Dispense Refill  ? amLODipine (NORVASC) 5 MG tablet Take 5 mg by mouth daily.    ? Bacillus Coagulans-Inulin (ALIGN PREBIOTIC-PROBIOTIC) 5-1.25 MG-GM CHEW Chew 1 Dose by mouth daily at 12 noon.    ? diphenoxylate-atropine (LOMOTIL) 2.5-0.025 MG tablet Take 1 tablet by mouth 4 (four) times daily as needed for diarrhea or loose stools. 30 tablet 0  ? levothyroxine (SYNTHROID) 50 MCG tablet Take 50 mcg by mouth daily.    ? loratadine (CLARITIN) 10 MG tablet Take 10 mg by  mouth daily.    ? Omega-3 Fatty Acids (FISH OIL) 500 MG CAPS Take 1 capsule by mouth daily at 12 noon.    ? oxybutynin (DITROPAN) 5 MG tablet Take 5 mg by mouth daily.    ? pravastatin (PRAVACHOL) 80 MG tablet Take 40 mg by mouth daily.    ? telmisartan (MICARDIS) 40 MG tablet Take 20 mg by mouth daily.    ? omeprazole (PRILOSEC) 40 MG capsule Take 40 mg by mouth daily.    ? tadalafil (CIALIS) 5 MG tablet Take 1 tablet (5 mg total) by mouth daily as needed for erectile dysfunction. 30 tablet 11  ? ?Current Facility-Administered Medications  ?Medication Dose Route Frequency Provider Last Rate Last Admin  ? 0.9 %  sodium chloride infusion  500 mL Intravenous Once Doran Stabler, MD      ? ? ? ? ?___________________________________________________________________ ?Objective  ? ?Exam: ? ?BP 125/67   Pulse 75   Temp 97.8 ?F (36.6 ?C) (Temporal)   Ht 5\' 8"  (1.727 m)   Wt 212 lb (96.2 kg)   SpO2 96%   BMI 32.23 kg/m?  ? ?CV: RRR without murmur, S1/S2 ?Resp: clear to auscultation bilaterally, normal RR and effort noted ?GI: soft, no tenderness, with active bowel sounds. ? ? ?Assessment: ?Encounter Diagnosis  ?Name Primary?  ? Chronic diarrhea Yes  ? ? ? ?Plan: ?Colonoscopy ? The  benefits and risks of the planned procedure were described in detail with the patient or (when appropriate) their health care proxy.  Risks were outlined as including, but not limited to, bleeding, infection, perforation, adverse medication reaction leading to cardiac or pulmonary decompensation, pancreatitis (if ERCP).  The limitation of incomplete mucosal visualization was also discussed.  No guarantees or warranties were given. ? ? ? ?The patient is appropriate for an endoscopic procedure in the ambulatory setting. ? ? - Wilfrid Lund, MD ? ? ? ? ?

## 2021-06-28 NOTE — Progress Notes (Signed)
Report given to PACU, vss 

## 2021-06-28 NOTE — Op Note (Signed)
[facility] ?Patient Name: Brandon Giles ?Procedure Date: 06/28/2021 9:57 AM ?MRN: [id number] ?Endoscopist: Estill Cotta. Loletha Carrow , MD ?Age: 61 ?Referring MD:  ?Date of Birth: [birth year] ?Gender: Male ?Account #: [id number] ?Procedure:                Colonoscopy ?Indications:              Chronic diarrhea ?Medicines:                Monitored Anesthesia Care ?Procedure:                Pre-Anesthesia Assessment: ?                          - Prior to the procedure, a History and Physical  ?                          was performed, and patient medications and  ?                          allergies were reviewed. The patient's tolerance of  ?                          previous anesthesia was also reviewed. The risks  ?                          and benefits of the procedure and the sedation  ?                          options and risks were discussed with the patient.  ?                          All questions were answered, and informed consent  ?                          was obtained. Prior Anticoagulants: The patient has  ?                          taken no previous anticoagulant or antiplatelet  ?                          agents. ASA Grade Assessment: II - A patient with  ?                          mild systemic disease. After reviewing the risks  ?                          and benefits, the patient was deemed in  ?                          satisfactory condition to undergo the procedure. ?                          After obtaining informed consent, the colonoscope  ?  was passed under direct vision. Throughout the  ?                          procedure, the patient's blood pressure, pulse, and  ?                          oxygen saturations were monitored continuously. The  ?                          CF [id number] #[id number] was introduced through the anus  ?                          and advanced to the the terminal ileum, with  ?                          identification of the appendiceal orifice and  IC  ?                          valve. The colonoscopy was performed without  ?                          difficulty. The patient tolerated the procedure  ?                          well. The quality of the bowel preparation was  ?                          generally good, with scattered residual fibrous  ?                          debris desipte lavage. The terminal ileum,  ?                          ileocecal valve, appendiceal orifice, and rectum  ?                          were photographed. ?Scope In: 10:05:51 AM ?Scope Out: 10:19:42 AM ?Scope Withdrawal Time: 0 hours 12 minutes 33 seconds  ?Total Procedure Duration: 0 hours 13 minutes 51 seconds  ?Findings:                 The perianal and digital rectal examinations were  ?                          normal. ?                          The terminal ileum appeared normal. ?                          Repeat examination of right colon under NBI  ?                          performed. ?  Two sessile polyps were found in the cecum. The  ?                          polyps were 2 to 6 mm in size. These polyps were  ?                          removed with a cold snare. Resection and retrieval  ?                          were complete. ?                          Normal mucosa was found in the entire colon.  ?                          Biopsies for histology were taken with a cold  ?                          forceps from the right colon and left colon for  ?                          evaluation of microscopic colitis. ?                          Multiple diverticula were found in the left colon. ?                          The exam was otherwise without abnormality on  ?                          direct and retroflexion views. ?Complications:            No immediate complications. ?Estimated Blood Loss:     Estimated blood loss was minimal. ?Impression:               - The examined portion of the ileum was normal. ?                          - Two 2 to 6 mm  polyps in the cecum, removed with a  ?                          cold snare. Resected and retrieved. ?                          - Normal mucosa in the entire examined colon.  ?                          Biopsied. ?                          - Diverticulosis in the left colon. ?                          - The examination was otherwise normal on direct  ?  and retroflexion views. ?Recommendation:           - Patient has a contact number available for  ?                          emergencies. The signs and symptoms of potential  ?                          delayed complications were discussed with the  ?                          patient. Return to normal activities tomorrow.  ?                          Written discharge instructions were provided to the  ?                          patient. ?                          - Resume previous diet. ?                          - Continue present medications. ?                          - Await pathology results. ?                          - Repeat colonoscopy is recommended for  ?                          surveillance. The colonoscopy date will be  ?                          determined after pathology results from today's  ?                          exam become available for review. ?Jennings Corado L. Loletha Carrow, MD ?06/28/2021 10:27:38 AM ?This report has been signed electronically. ?

## 2021-06-28 NOTE — Patient Instructions (Addendum)
Resume previous medications.  2 polyps removed and sent to pathology. Await results for final recommendations.   ? ?Handouts on findings given to patient (polyps, diverticulosis and hemorrhoids)  ? ?YOU HAD AN ENDOSCOPIC PROCEDURE TODAY AT [facility]:   Refer to the procedure report that was given to you for any specific questions about what was found during the examination.  If the procedure report does not answer your questions, please call your gastroenterologist to clarify.  If you requested that your care partner not be given the details of your procedure findings, then the procedure report has been included in a sealed envelope for you to review at your convenience later. ? ?YOU SHOULD EXPECT: Some feelings of bloating in the abdomen. Passage of more gas than usual.  Walking can help get rid of the air that was put into your GI tract during the procedure and reduce the bloating. If you had a lower endoscopy (such as a colonoscopy or flexible sigmoidoscopy) you may notice spotting of blood in your stool or on the toilet paper. If you underwent a bowel prep for your procedure, you may not have a normal bowel movement for a few days. ? ?Please Note:  You might notice some irritation and congestion in your nose or some drainage.  This is from the oxygen used during your procedure.  There is no need for concern and it should clear up in a day or so. ? ?SYMPTOMS TO REPORT IMMEDIATELY: ? ?Following lower endoscopy (colonoscopy or flexible sigmoidoscopy): ? Excessive amounts of blood in the stool ? Significant tenderness or worsening of abdominal pains ? Swelling of the abdomen that is new, acute ? Fever of 100?F or higher ? ? ?For urgent or emergent issues, a gastroenterologist can be reached at any hour by calling [phone]. ?Do not use MyChart messaging for urgent concerns.  ? ? ?DIET:  We do recommend a small meal at first, but then you may proceed to your regular diet.  Drink plenty of  fluids but you should avoid alcoholic beverages for 24 hours. ? ?ACTIVITY:  You should plan to take it easy for the rest of today and you should NOT DRIVE or use heavy machinery until tomorrow (because of the sedation medicines used during the test).   ? ?FOLLOW UP: ?Our staff will call the number listed on your records 48-72 hours following your procedure to check on you and address any questions or concerns that you may have regarding the information given to you following your procedure. If we do not reach you, we will leave a message.  We will attempt to reach you two times.  During this call, we will ask if you have developed any symptoms of COVID 19. If you develop any symptoms (ie: fever, flu-like symptoms, shortness of breath, cough etc.) before then, please call [phone].  If you test positive for Covid 19 in the 2 weeks post procedure, please call and report this information to [place].   ? ?If any biopsies were taken you will be contacted by phone or by letter within the next 1-3 weeks.  Please call us at [phone] if you have not heard about the biopsies in 3 weeks.  ? ? ?SIGNATURES/CONFIDENTIALITY: ?You and/or your care partner have signed paperwork which will be entered into your electronic medical record.  These signatures attest to the fact that that the information above on your After Visit Summary has been reviewed and is understood.  Full responsibility of  the confidentiality of this discharge information lies with you and/or your care-partner.  ?

## 2021-06-28 NOTE — Progress Notes (Signed)
Pt's states no medical or surgical changes since previsit or office visit. 

## 2021-07-02 ENCOUNTER — Telehealth: Payer: Self-pay | Admitting: *Deleted

## 2021-07-02 ENCOUNTER — Encounter: Payer: Self-pay | Admitting: Gastroenterology

## 2021-07-02 NOTE — Telephone Encounter (Signed)
First attempt, left VM.  

## 2021-07-02 NOTE — Telephone Encounter (Signed)
No answer on second attempt follow up call.  ? ?

## 2021-07-03 ENCOUNTER — Other Ambulatory Visit: Payer: Self-pay

## 2021-07-03 ENCOUNTER — Other Ambulatory Visit: Payer: 59

## 2021-07-03 ENCOUNTER — Encounter: Payer: Self-pay | Admitting: Gastroenterology

## 2021-07-03 DIAGNOSIS — K529 Noninfective gastroenteritis and colitis, unspecified: Secondary | ICD-10-CM

## 2021-07-03 DIAGNOSIS — R197 Diarrhea, unspecified: Secondary | ICD-10-CM

## 2021-07-04 ENCOUNTER — Other Ambulatory Visit: Payer: 59

## 2021-07-04 DIAGNOSIS — K529 Noninfective gastroenteritis and colitis, unspecified: Secondary | ICD-10-CM

## 2021-07-04 DIAGNOSIS — R197 Diarrhea, unspecified: Secondary | ICD-10-CM

## 2021-07-07 LAB — GI PROFILE, STOOL, PCR

## 2021-07-16 MED ORDER — VIBERZI 100 MG PO TABS: 1.0000 | ORAL_TABLET | Freq: Every day | ORAL | 0 refills | Status: AC

## 2021-07-16 NOTE — Telephone Encounter (Signed)
I know he is having a very difficult time with this.  The symptoms are more severe than I would expect to see for IBS, so we need to move on with additional testing to look for unusual causes of diarrhea, since our testing thus far has not given [place] an answer. ? ?Recommendations: ? ?24-hour urine for 5 HIAA ? ?If he is able to remain off acid suppression medicine for 5 days, then we will get the most accurate results from the following blood tests: ? ?Fasting serum levels of gastrin, calcitonin, somatostatin, VIP (vasoactive intestinal peptide) ?(After getting them done, resume omeprazole because I do not believe it is the cause of this diarrhea.) ? ?CT scan abdomen and pelvis with oral and IV contrast to rule out any chronic inflammatory or fistulizing cause of diarrhea. ? ?[facility] breath test for small intestinal bacterial overgrowth ? ?If we have samples of Viberzi 100 mg, then I would like him to try that once daily for 7 to 10 days (stop if he develops constipation) ? ?-HD ?

## 2021-07-16 NOTE — Telephone Encounter (Signed)
Patient notified of recommendations via my chart.  ?Lab order and urine order in epic.  ?CT order in epic. Secure staff message sent to radiology scheduling to contact pt to set up appt. ?Samples of Viberzi placed at 2nd floor receptionist desk for pt to pick up. ?SIBO breath tests are currently on back order, will order once available.  ?

## 2021-07-26 ENCOUNTER — Encounter: Payer: Self-pay | Admitting: Urology

## 2021-07-26 ENCOUNTER — Ambulatory Visit: Payer: 59 | Admitting: Urology

## 2021-07-26 VITALS — BP 121/77 | HR 65

## 2021-07-26 DIAGNOSIS — N138 Other obstructive and reflux uropathy: Secondary | ICD-10-CM

## 2021-07-26 DIAGNOSIS — N401 Enlarged prostate with lower urinary tract symptoms: Secondary | ICD-10-CM | POA: Diagnosis not present

## 2021-07-26 DIAGNOSIS — N5201 Erectile dysfunction due to arterial insufficiency: Secondary | ICD-10-CM

## 2021-07-26 DIAGNOSIS — E291 Testicular hypofunction: Secondary | ICD-10-CM

## 2021-07-26 DIAGNOSIS — R350 Frequency of micturition: Secondary | ICD-10-CM

## 2021-07-26 DIAGNOSIS — R972 Elevated prostate specific antigen [PSA]: Secondary | ICD-10-CM

## 2021-07-26 DIAGNOSIS — R351 Nocturia: Secondary | ICD-10-CM

## 2021-07-26 LAB — URINALYSIS, ROUTINE W REFLEX MICROSCOPIC
Bilirubin, UA: NEGATIVE
Glucose, UA: NEGATIVE
Ketones, UA: NEGATIVE
Leukocytes,UA: NEGATIVE
Nitrite, UA: NEGATIVE
Protein,UA: NEGATIVE
RBC, UA: NEGATIVE
Specific Gravity, UA: 1.02 (ref 1.005–1.030)
Urobilinogen, Ur: 0.2 mg/dL (ref 0.2–1.0)
pH, UA: 7 (ref 5.0–7.5)

## 2021-07-26 MED ORDER — CIPROFLOXACIN HCL 500 MG PO TABS: 500.0000 mg | ORAL_TABLET | Freq: Once | ORAL | Status: DC

## 2021-07-26 NOTE — Progress Notes (Signed)
Patient ID: Brandon Giles, male   DOB: 02/17/1961, 61 y.o.   MRN: [id number] ? ?Subjective: ? ?1. BPH with urinary obstruction   ?2. Urinary frequency   ?3. Nocturia   ?4. Erectile dysfunction due to arterial insufficiency   ?5. Elevated prostate specific antigen (PSA)   ?6. Primary hypogonadism in male   ?  ?Brandon Giles returns today in f/u for his history of an elevated PSA of 4.4  with a negative biopsy in 9/20.   He has BPH with BOO and has been on tamsulosin.  He has ED and uses sildenafil.  He has hypogonadism and has been on clomiphene.   ? ?05/25/20:  His PSA was up to 7.6 in 03/20/20.   His testosterone is normal at 383 with a normal Hgb.  He has moderate to severe LUTS with an IPSS of 21.  He primarily complains of urgency and frequency.  He has nocturia 3-4x.  He drinks a lot of fluid.  He has 2 cups of coffee qam. His PVR today is low.  He had inadequate volume for a flow rate.   He has no sensation of ejaculation with the tamsulosin.   He has no dysuria or hematuria.    ? ? 07/06/20: Brandon Giles returns today in f/u.  His PSA was up to 7.9 in 1/22 and a prostate MRI was ordered.  He was switched to alfuzosin at his last visit.  He feels it is working better but he has fewer bowel movements and he fells bloated and has some back pain in the right low back.  His IPSS is 20.   The prostate MRI showed no high risk lesions but he had PIRADs 2 changes in the PZ that were non-specific and could be associated with prostatitis but his UA's have been clear.   He continues to use sildenafil but is off of clomid.  ? ?10/12/20: Brandon Giles returns today in f/u.  His PSA is down slightly to 7.6 with a 21% f/t ratio.  He remains on alfuzosin but is off of clomiphene.  He has some difficulty voiding at night and has nocturia x 4.  His IPSS is 23.  He has some frequency.  He has intermittency at night with a return within 1 hour of voiding.  He has had no hematuria or dysuria.  His prostate volume in 9/20 was 55m.  ? ?04/12/21:  Brandon Giles returns today in f/u.  His PSA is down to 5.9 with a 20.5% f/t ratio.  His testosterone is 340.  He remains off of Clomid.  He remains on the alfuzosin and oxybutynin IR '5mg'$  at bedtime.  He has persistent moderate to severe LUTS with an IPSS of 25.  He has a sensation of incomplete emptying, frequency, urgency, nocturia x 4 and a reduced stream  He has gained about 10 lbs.   He has ED and has sildenafil but he hasn't been active.  His prostate volume is 47 ml on the MRI from 2/22.  He has had some issues with small frequent loose stools and he may have a BM 5-10x daily.   He has had some mild IBS.   He has not had COVID.  ? ?07/26/21: Brandon Proudreturns today in f/u for his history of BPH with BOO and LUTS.  He is off of the alfuzosin but remains on oxybutynin at bedtime.  He continues to use tadalafil '5mg'$  daily which has been helping his nocturia.  He has sildenafil for his ED but he  rarely takes.  He is no longer on clomid or TRT.  His IPSS is 14 which is markedly improved.  He is dealing recently with chronic diarrhea and is being evaluated by GI.  ? ? IPSS   ? ? Brandon Giles 07/26/21 0900  ?  ?  ?  ? International Prostate Symptom Score  ? How often have you had the sensation of not emptying your bladder? Less than half the time    ? How often have you had to urinate less than every two hours? Less than half the time    ? How often have you found you stopped and started again several times when you urinated? About half the time    ? How often have you found it difficult to postpone urination? Less than half the time    ? How often have you had a weak urinary stream? Less than half the time    ? How often have you had to strain to start urination? Less than 1 in 5 times    ? How many times did you typically get up at night to urinate? 2 Times    ? Total IPSS Score 14    ?  ? Quality of Life due to urinary symptoms  ? If you were to spend the rest of your life with your urinary condition just the way it is now how would you  feel about that? Mixed    ? ?  ?  ? ?  ? ? ? ? ?ROS: ? ?ROS:  ?A complete review of systems was performed.  All systems are negative except for pertinent findings as noted.  ? ?Review of Systems  ?Gastrointestinal:  Positive for diarrhea and heartburn.  ? ?Allergies  ?Allergen Reactions  ? Hydrocodone-Acetaminophen Other (See Comments)  ?  Doesn't like how it makes him feel-dizzy,tachycardic  ? ? ?Outpatient Encounter Medications as of 07/26/2021  ?Medication Sig  ? amLODipine (NORVASC) 5 MG tablet Take 5 mg by mouth daily.  ? levothyroxine (SYNTHROID) 50 MCG tablet Take 50 mcg by mouth daily.  ? loratadine (CLARITIN) 10 MG tablet Take 10 mg by mouth daily.  ? Omega-3 Fatty Acids (FISH OIL) 500 MG CAPS Take 1 capsule by mouth daily at 12 noon.  ? oxybutynin (DITROPAN) 5 MG tablet Take 5 mg by mouth daily.  ? pravastatin (PRAVACHOL) 80 MG tablet Take 40 mg by mouth daily.  ? tadalafil (CIALIS) 5 MG tablet Take 1 tablet (5 mg total) by mouth daily as needed for erectile dysfunction.  ? telmisartan (MICARDIS) 40 MG tablet Take 20 mg by mouth daily.  ? omeprazole (PRILOSEC) 40 MG capsule Take 40 mg by mouth daily.  ? valACYclovir (VALTREX) 500 MG tablet Take 500 mg by mouth 2 (two) times daily. (Patient not taking: Reported on 07/26/2021)  ? [DISCONTINUED] alfuzosin (UROXATRAL) 10 MG 24 hr tablet Take 10 mg by mouth daily. (Patient not taking: Reported on 07/26/2021)  ? [DISCONTINUED] Bacillus Coagulans-Inulin (ALIGN PREBIOTIC-PROBIOTIC) 5-1.25 MG-GM CHEW Chew 1 Dose by mouth daily at 12 noon.  ? [DISCONTINUED] diphenoxylate-atropine (LOMOTIL) 2.5-0.025 MG tablet Take 1 tablet by mouth 4 (four) times daily as needed for diarrhea or loose stools. (Patient not taking: Reported on 07/26/2021)  ? [DISCONTINUED] ciprofloxacin (CIPRO) tablet 500 mg   ? ?No facility-administered encounter medications on file as of 07/26/2021.  ? ? ?Past Medical History:  ?Diagnosis Date  ? Allergy   ? seasonal  ? 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 Hypertension   ? PONV (postoperative nausea and vomiting)   ? after hernia repair  ? Shortness of breath   ? with exercise-states 'out of shape'  ? Sleep apnea   ? CPAP  ? ? ?Past Surgical History:  ?Procedure Laterality Date  ? COLONOSCOPY  07/15/2013  ? Dr.Brodie  ? GANGLION CYST EXCISION  12/25/2011  ? Procedure: REMOVAL GANGLION OF WRIST;  Surgeon: Magnus Sinning, MD;  Location: [facility];  Service: Orthopedics;  Laterality: Left;  excision of ganglion cyst of left wrist   ? HERNIA REPAIR  mid '90's  ? bilateral ing  ? KNEE ARTHROSCOPY  '99  ? left   ? NASAL SEPTOPLASTY W/ TURBINOPLASTY Bilateral 06/04/2013  ? Procedure: BILATERNAL NASAL SEPTOPLASTY WITH TURBINATE REDUCTION;  Surgeon: Izora Gala, MD;  Location: [facility];  Service: ENT;  Laterality: Bilateral;  ? SEPTOPLASTY    ? TONSILLECTOMY    ? ? ?Social History  ? ?Socioeconomic History  ? Marital status: Married  ?  Spouse Giles: Not on file  ? Number of children: Not on file  ? Years of education: Not on file  ? Highest education level: Not on file  ?Occupational History  ? Occupation: Retired  ?Tobacco Use  ? Smoking status: Never  ? Smokeless tobacco: Never  ?Vaping Use  ? Vaping Use: Never used  ?Substance and Sexual Activity  ? Alcohol use: No  ? Drug use: No  ? Sexual activity: Not on file  ?Other Topics Concern  ? Not on file  ?Social History Narrative  ? Not on file  ? ?Social Determinants of Health  ? ?Financial Resource Strain: Not on file  ?Food Insecurity: Not on file  ?Transportation Needs: Not on file  ?Physical Activity: Not on file  ?Stress: Not on file  ?Social Connections: Not on file  ?Intimate Partner Violence: Not on file  ? ? ?Family History  ?Problem Relation Age of Onset  ? Heart disease Mother   ? Lung cancer Mother   ? Stroke Mother   ? Diabetes Father   ? Colon cancer Neg Hx   ? Esophageal cancer Neg Hx   ? Rectal cancer Neg Hx   ? Stomach cancer Neg Hx   ? Colon polyps Neg Hx    ? ? ? ? ? ?Objective: ?Vitals:  ? 07/26/21 0938  ?BP: 121/77  ?Pulse: 65  ? ? ? ?Physical Exam ? ?Lab Results:  ?PSA ?No results found for: PSA ?Testosterone  ?Date Value Ref Range Status  ?04/05/2021 340 264 - 916 ng

## 2021-07-27 ENCOUNTER — Other Ambulatory Visit: Payer: 59

## 2021-07-27 DIAGNOSIS — K529 Noninfective gastroenteritis and colitis, unspecified: Secondary | ICD-10-CM

## 2021-07-31 ENCOUNTER — Ambulatory Visit (HOSPITAL_COMMUNITY)
Admission: RE | Admit: 2021-07-31 | Discharge: 2021-07-31 | Disposition: A | Discharge status: Home / Self Care | Payer: 59 | Source: Ambulatory Visit | Attending: Gastroenterology | Admitting: Gastroenterology

## 2021-07-31 ENCOUNTER — Encounter (HOSPITAL_COMMUNITY): Payer: Self-pay | Admitting: Radiology

## 2021-07-31 ENCOUNTER — Other Ambulatory Visit: Payer: 59

## 2021-07-31 DIAGNOSIS — R197 Diarrhea, unspecified: Secondary | ICD-10-CM | POA: Insufficient documentation

## 2021-07-31 DIAGNOSIS — K529 Noninfective gastroenteritis and colitis, unspecified: Secondary | ICD-10-CM | POA: Insufficient documentation

## 2021-07-31 LAB — POCT I-STAT CREATININE: Creatinine, Ser: 1.4 mg/dL — ABNORMAL HIGH (ref 0.61–1.24)

## 2021-07-31 MED ORDER — IOHEXOL 300 MG/ML  SOLN
100.0000 mL | Freq: Once | INTRAMUSCULAR | Status: AC | PRN
  Administered 2021-07-31: 100 mL via INTRAVENOUS

## 2021-08-01 ENCOUNTER — Encounter: Payer: Self-pay | Admitting: Gastroenterology

## 2021-08-02 ENCOUNTER — Encounter: Payer: Self-pay | Admitting: Gastroenterology

## 2021-08-10 LAB — VASOACTIVE INTESTINAL PEPTIDE (VIP): Vasoactive Intest Polypeptide: <50 pg/mL (ref <78)

## 2021-08-10 LAB — 5 HIAA, QUANTITATIVE, URINE, 24 HOUR

## 2021-08-10 LAB — CALCITONIN: Calcitonin: <2 pg/mL (ref <=10)

## 2021-08-10 LAB — SOMATOSTATIN: SOMATOSTATIN: <21 pg/mL

## 2021-08-10 LAB — GASTRIN: Gastrin: 40 pg/mL (ref <=100)

## 2021-08-14 ENCOUNTER — Encounter: Payer: Self-pay | Admitting: Gastroenterology

## 2021-08-14 DIAGNOSIS — R634 Abnormal weight loss: Secondary | ICD-10-CM

## 2021-08-14 DIAGNOSIS — K529 Noninfective gastroenteritis and colitis, unspecified: Secondary | ICD-10-CM

## 2021-08-19 LAB — 5 HIAA, QUANTITATIVE, URINE, 24 HOUR
5 HIAA, 24 Hour Urine: 4.1 mg/24 h (ref <=6.0)
Total Volume: 1900 mL

## 2021-08-20 ENCOUNTER — Telehealth: Payer: Self-pay | Admitting: Gastroenterology

## 2021-08-20 NOTE — Telephone Encounter (Signed)
Pt returned call. He saw his EGD appt on MyChart and states that the appt is fine. He is aware that he will need to arrive at 9 am with a care partner. Pt is aware that I will send his instructions to him via MyChart. Pt is aware that a PA for Xifaxan has been submitted and we will await the response from his insurance. Pt is aware that we will be able to send RX once we have the approval from his insurance. He is also aware that I have enrolled him in the co-pay assistance program for Xifaxan. Pt verbalized understanding of all information and had no concerns at the end of the call.  ? ?Ambulatory referral to GI in epic.  ?EGD instructions sent to patient via [facility].  ?

## 2021-08-20 NOTE — Telephone Encounter (Signed)
The short answer on results is all normal , now that the urine study is resulted. ?( I addressed more specifics in a prior message) ? ?My advice is to do both: ? ?EGD with me in the [facility] - next available appointment that he can attend. ? ?Rifaximin 550 mg ?Sig: one tablet twice daily x 14 days ?Disp #28, RF zero ? ?- HD ?

## 2021-08-20 NOTE — Telephone Encounter (Signed)
Returned call to patient. See 08/14/21 patient message for details. ? ?

## 2021-08-20 NOTE — Telephone Encounter (Addendum)
Lm on vm for patient to return call. ? ?Patient has been scheduled for an EGD in the [facility] with Dr. Loletha Carrow on Thursday, 09/13/21 at 10 am. Pt will need to arrive at 9 am with a care partner.  ? ?PA submitted to [facility] for Xifaxan 550 mg tablets ?Key: [id number] ? ?Patient has been enrolled in [place] co-pay assistance card. ? ? ?

## 2021-08-20 NOTE — Telephone Encounter (Signed)
Patient is returning your call. I let patient know of his scheduled EGD procedure. Patient has questions regarding the procedure. Please advise. ?

## 2021-08-22 MED ORDER — RIFAXIMIN 550 MG PO TABS: 550.0000 mg | ORAL_TABLET | Freq: Two times a day (BID) | ORAL | 0 refills | Status: AC

## 2021-08-22 NOTE — Telephone Encounter (Signed)
He was already given a trial of metronidazole months ago that did not help. ? ?I was afraid the rifaximin might not be approved since he does not yet have a clear diagnosis that is an approved indication. ? ?Before we plan lactulose breath testing for SIBO, please see if [facility] happens to have any samples of rifaximin that may be sufficient to give him treatment course as outlined. ? ?HD ?

## 2021-09-06 ENCOUNTER — Encounter: Payer: Self-pay | Admitting: Gastroenterology

## 2021-09-09 ENCOUNTER — Encounter: Payer: Self-pay | Admitting: Certified Registered Nurse Anesthetist

## 2021-09-11 NOTE — Telephone Encounter (Signed)
I am sorry to hear he is still having this much trouble. ? ?I still want him to come for the upper endoscopy as scheduled this week so I can take biopsies in the small intestine and see if there is an identifiable cause for the diarrhea in that area. ? ?-HD ?

## 2021-09-13 ENCOUNTER — Telehealth: Payer: Self-pay

## 2021-09-13 ENCOUNTER — Ambulatory Visit (AMBULATORY_SURGERY_CENTER): Payer: 59 | Admitting: Gastroenterology

## 2021-09-13 ENCOUNTER — Encounter: Payer: Self-pay | Admitting: Gastroenterology

## 2021-09-13 VITALS — BP 125/78 | HR 67 | Temp 98.2°F | Resp 11 | Ht 68.0 in | Wt 210.0 lb

## 2021-09-13 DIAGNOSIS — K529 Noninfective gastroenteritis and colitis, unspecified: Secondary | ICD-10-CM | POA: Diagnosis present

## 2021-09-13 DIAGNOSIS — R634 Abnormal weight loss: Secondary | ICD-10-CM

## 2021-09-13 DIAGNOSIS — R197 Diarrhea, unspecified: Secondary | ICD-10-CM

## 2021-09-13 MED ORDER — SODIUM CHLORIDE 0.9 % IV SOLN: 500.0000 mL | Freq: Once | INTRAVENOUS | Status: DC

## 2021-09-13 NOTE — Telephone Encounter (Signed)
Brandon Giles, this patient just had an EGD with me today for chronic diarrhea.  He has had an extensive work-up for this, but I would like to check him again for C. difficile.  He is willing to come to the lab early next week (he is leaving tomorrow for a weekend trip).   Please place orders for a C. difficile PCR, AB toxin and GDH antigen.  After you have done so, please send him a portal message to let him know the orders are in.  Thank you -HD

## 2021-09-13 NOTE — Progress Notes (Signed)
1000 Robinul 0.1 mg IV given due large amount of secretions upon assessment.  MD made aware, vss 

## 2021-09-13 NOTE — Progress Notes (Signed)
Report given to PACU, vss 

## 2021-09-13 NOTE — Patient Instructions (Signed)
Read all of the information given to you by your recovery room nurse.  The office will call you regarding the C-diff testing.  YOU HAD AN ENDOSCOPIC PROCEDURE TODAY AT [facility]:   Refer to the procedure report that was given to you for any specific questions about what was found during the examination.  If the procedure report does not answer your questions, please call your gastroenterologist to clarify.  If you requested that your care partner not be given the details of your procedure findings, then the procedure report has been included in a sealed envelope for you to review at your convenience later.  YOU SHOULD EXPECT: Some feelings of bloating in the abdomen. Passage of more gas than usual.  Walking can help get rid of the air that was put into your GI tract during the procedure and reduce the bloating. If you had a lower endoscopy (such as a colonoscopy or flexible sigmoidoscopy) you may notice spotting of blood in your stool or on the toilet paper. If you underwent a bowel prep for your procedure, you may not have a normal bowel movement for a few days.  Please Note:  You might notice some irritation and congestion in your nose or some drainage.  This is from the oxygen used during your procedure.  There is no need for concern and it should clear up in a day or so.  SYMPTOMS TO REPORT IMMEDIATELY:  Following lower endoscopy (colonoscopy or flexible sigmoidoscopy):  Excessive amounts of blood in the stool  Significant tenderness or worsening of abdominal pains  Swelling of the abdomen that is new, acute  Fever of 100F or higher   For urgent or emergent issues, a gastroenterologist can be reached at any hour by calling [phone]. Do not use MyChart messaging for urgent concerns.    DIET:  We do recommend a small meal at first, but then you may proceed to your regular diet.  Drink plenty of fluids but you should avoid alcoholic beverages for 24 hours.  ACTIVITY:   You should plan to take it easy for the rest of today and you should NOT DRIVE or use heavy machinery until tomorrow (because of the sedation medicines used during the test).    FOLLOW UP: Our staff will call the number listed on your records 48-72 hours following your procedure to check on you and address any questions or concerns that you may have regarding the information given to you following your procedure. If we do not reach you, we will leave a message.  We will attempt to reach you two times.  During this call, we will ask if you have developed any symptoms of COVID 19. If you develop any symptoms (ie: fever, flu-like symptoms, shortness of breath, cough etc.) before then, please call [phone].  If you test positive for Covid 19 in the 2 weeks post procedure, please call and report this information to [place].    If any biopsies were taken you will be contacted by phone or by letter within the next 1-3 weeks.  Please call us at [phone] if you have not heard about the biopsies in 3 weeks.    SIGNATURES/CONFIDENTIALITY: You and/or your care partner have signed paperwork which will be entered into your electronic medical record.  These signatures attest to the fact that that the information above on your After Visit Summary has been reviewed and is understood.  Full responsibility of the confidentiality of this discharge information lies  with you and/or your care-partner.  

## 2021-09-13 NOTE — Telephone Encounter (Signed)
Patient has reviewed MyChart message. Last read by Bonnee Quin Froh "Jeff" at 11:09 AM on 09/13/2021.

## 2021-09-13 NOTE — Progress Notes (Signed)
Called to room to assist during endoscopic procedure.  Patient ID and intended procedure confirmed with present staff. Received instructions for my participation in the procedure from the performing physician.  

## 2021-09-13 NOTE — Op Note (Addendum)
[place] Patient Name: Brandon Giles Procedure Date: 09/13/2021 9:59 AM MRN: [id number] Endoscopist: Mallie Mussel L. Danis , MD Age: 61 Referring MD:  Date of Birth: August 10, 1960 Gender: Male Account #: [id number] Procedure:                Upper GI endoscopy Indications:              Endoscopy to assess diarrhea in patient suspected                            of having disease of the small-bowel, Weight loss                            (modest)                           acute onset, then chronic since Oct 2022, extensive                            negative w/u and little improvement from different                            medicines (summarized in today's H+P) Medicines:                Monitored Anesthesia Care Procedure:                Pre-Anesthesia Assessment:                           - Prior to the procedure, a History and Physical                            was performed, and patient medications and                            allergies were reviewed. The patient's tolerance of                            previous anesthesia was also reviewed. The risks                            and benefits of the procedure and the sedation                            options and risks were discussed with the patient.                            All questions were answered, and informed consent                            was obtained. Prior Anticoagulants: The patient has                            taken no previous anticoagulant or antiplatelet  agents. ASA Grade Assessment: II - A patient with                            mild systemic disease. After reviewing the risks                            and benefits, the patient was deemed in                            satisfactory condition to undergo the procedure.                           After obtaining informed consent, the endoscope was                            passed under direct vision. Throughout the                             procedure, the patient's blood pressure, pulse, and                            oxygen saturations were monitored continuously. The                            Endoscope was introduced through the mouth, and                            advanced to the second part of duodenum. The upper                            GI endoscopy was accomplished without difficulty.                            The patient tolerated the procedure well. Scope In: Scope Out: Findings:                 The esophagus was normal.                           The stomach was normal. Gastric folds normal.                           The cardia and gastric fundus were normal on                            retroflexion.                           Patchy atrophic mucosa was found in the second                            portion of the duodenum.(mild, non-specific                            finding) Biopsies were taken with a cold  forceps                            for histology. Complications:            No immediate complications. Estimated Blood Loss:     Estimated blood loss was minimal. Impression:               - Normal esophagus.                           - Normal stomach.                           - Duodenal mucosal atrophy. Biopsied. Non-specific                            finding. Patient was on an ARB - will question to                            discover if still taking it. Recommendation:           - Patient has a contact number available for                            emergencies. The signs and symptoms of potential                            delayed complications were discussed with the                            patient. Return to normal activities tomorrow.                            Written discharge instructions were provided to the                            patient.                           - Resume previous diet.                           - Continue present medications.                            - Await pathology results.                           - Repeat C difficile testing with PCR, Toxin A/B                            and Antigen                           If this testing is negative, refer to tertiary  medical center for consultation. Abriella Filkins L. Loletha Carrow, MD 09/13/2021 10:30:41 AM This report has been signed electronically.

## 2021-09-13 NOTE — Progress Notes (Signed)
I have reviewed the patient's medical history in detail and updated the computerized patient record.

## 2021-09-13 NOTE — Telephone Encounter (Signed)
Stool study orders in epic.  MyChart sent to message with recommendations.

## 2021-09-13 NOTE — Progress Notes (Signed)
History and Physical:  This patient presents for endoscopic testing for: Encounter Diagnoses  Name Primary?   Chronic diarrhea Yes   Weight loss     61 year old man here for continued work-up of chronic watery diarrhea and more recently weight loss.  It was of acute onset soon after having a routine surveillance colonoscopy in September 2022.  Thus far, he has had a very extensive work-up (negative repeat colonoscopy with biopsies, negative ova and parasite as well as subsequent GI pathogen panel including C. difficile toxin, negative work-up for hormone secreting GI tumors, negative TTG and total IgA level for celiac, no improvement on empiric metronidazole nor on a recent empiric course of rifaximin samples). Partial relief with Lomotil or Viberzi. Patient is otherwise without complaints or active issues today.   Past Medical History: Past Medical History:  Diagnosis Date   Allergy    seasonal   HOH (hard of hearing)    left ear   Hypertension    PONV (postoperative nausea and vomiting)    after hernia repair   Shortness of breath    with exercise-states 'out of shape'   Sleep apnea    CPAP     Past Surgical History: Past Surgical History:  Procedure Laterality Date   COLONOSCOPY  07/15/2013   Dr.Brodie   GANGLION CYST EXCISION  12/25/2011   Procedure: REMOVAL GANGLION OF WRIST;  Surgeon: Magnus Sinning, MD;  Location: [place];  Service: Orthopedics;  Laterality: Left;  excision of ganglion cyst of left wrist    HERNIA REPAIR  mid '90's   bilateral ing   KNEE ARTHROSCOPY  '99   left    NASAL SEPTOPLASTY W/ TURBINOPLASTY Bilateral 06/04/2013   Procedure: BILATERNAL NASAL SEPTOPLASTY WITH TURBINATE REDUCTION;  Surgeon: Izora Gala, MD;  Location: [place];  Service: ENT;  Laterality: Bilateral;   SEPTOPLASTY     TONSILLECTOMY      Allergies: Allergies  Allergen Reactions   Hydrocodone-Acetaminophen Other (See Comments)     Doesn't like how it makes him feel-dizzy,tachycardic    Outpatient Meds: Current Outpatient Medications  Medication Sig Dispense Refill   amLODipine (NORVASC) 5 MG tablet Take 5 mg by mouth daily.     levothyroxine (SYNTHROID) 50 MCG tablet Take 50 mcg by mouth daily.     loratadine (CLARITIN) 10 MG tablet Take 10 mg by mouth daily.     Omega-3 Fatty Acids (FISH OIL) 500 MG CAPS Take 1 capsule by mouth daily at 12 noon.     omeprazole (PRILOSEC) 40 MG capsule Take 40 mg by mouth daily.     oxybutynin (DITROPAN) 5 MG tablet Take 5 mg by mouth daily.     pravastatin (PRAVACHOL) 80 MG tablet Take 40 mg by mouth daily.     telmisartan (MICARDIS) 40 MG tablet Take 20 mg by mouth daily.     tadalafil (CIALIS) 5 MG tablet Take 1 tablet (5 mg total) by mouth daily as needed for erectile dysfunction. 30 tablet 11   valACYclovir (VALTREX) 500 MG tablet Take 500 mg by mouth 2 (two) times daily. (Patient not taking: Reported on 07/26/2021)     Current Facility-Administered Medications  Medication Dose Route Frequency Provider Last Rate Last Admin   0.9 %  sodium chloride infusion  500 mL Intravenous Once Nelida Meuse III, MD          ___________________________________________________________________ Objective   Exam:  BP 137/83   Pulse 68   Temp 98.2 F (  36.8 C)   Resp 15   Ht '5\' 8"'$  (1.727 m)   Wt 210 lb (95.3 kg)   SpO2 100%   BMI 31.93 kg/m   CV: RRR without murmur, S1/S2 Resp: clear to auscultation bilaterally, normal RR and effort noted GI: soft, no tenderness, with active bowel sounds.   Assessment: Encounter Diagnoses  Name Primary?   Chronic diarrhea Yes   Weight loss      Plan:  EGD with small bowel Biopsies   The patient is appropriate for an endoscopic procedure in the ambulatory setting.   - Wilfrid Lund, MD

## 2021-09-14 ENCOUNTER — Telehealth: Payer: Self-pay

## 2021-09-14 NOTE — Telephone Encounter (Signed)
Called [phone] and left a message we tried to reach pt for a follow up call. maw

## 2021-09-17 ENCOUNTER — Other Ambulatory Visit: Payer: 59

## 2021-09-18 ENCOUNTER — Other Ambulatory Visit: Payer: 59

## 2021-09-18 ENCOUNTER — Encounter: Payer: Self-pay | Admitting: Gastroenterology

## 2021-09-18 DIAGNOSIS — R634 Abnormal weight loss: Secondary | ICD-10-CM

## 2021-09-18 DIAGNOSIS — R197 Diarrhea, unspecified: Secondary | ICD-10-CM

## 2021-09-18 DIAGNOSIS — K529 Noninfective gastroenteritis and colitis, unspecified: Secondary | ICD-10-CM

## 2021-09-19 LAB — C. DIFFICILE GDH AND TOXIN A/B
GDH ANTIGEN: NOT DETECTED
MICRO NUMBER:: 13433772
SPECIMEN QUALITY:: ADEQUATE
TOXIN A AND B: NOT DETECTED

## 2021-09-21 ENCOUNTER — Encounter: Payer: Self-pay | Admitting: Gastroenterology

## 2021-09-21 LAB — CLOSTRIDIUM DIFFICILE BY PCR: Toxigenic C. Difficile by PCR: NEGATIVE

## 2021-10-02 ENCOUNTER — Ambulatory Visit: Payer: 59 | Admitting: Gastroenterology

## 2021-10-02 ENCOUNTER — Encounter: Payer: Self-pay | Admitting: Gastroenterology

## 2021-10-02 VITALS — BP 140/72 | HR 62 | Ht 68.0 in | Wt 208.0 lb

## 2021-10-02 DIAGNOSIS — K529 Noninfective gastroenteritis and colitis, unspecified: Secondary | ICD-10-CM | POA: Diagnosis not present

## 2021-10-02 MED ORDER — DIPHENOXYLATE-ATROPINE 2.5-0.025 MG PO TABS: 1.0000 | ORAL_TABLET | Freq: Four times a day (QID) | ORAL | 0 refills | Status: DC | PRN

## 2021-10-02 NOTE — Progress Notes (Signed)
[facility] Progress Note  Chief Complaint: Chronic diarrhea  Subjective  History: Brandon Giles follows up for his chronic diarrhea, details in prior office, procedure and result notes.  Extensive work-up for this diarrhea dating back to last September or October, with onset soon after a routine colonoscopy.  Most recently, he underwent upper endoscopy with small bowel biopsies to rule out a malabsorptive cause, all of which was normal (only some nonspecific mild patchy mucosal atrophy in the duodenum).  Repeat C. difficile testing with PCR, toxin and antigen negative.  I offered a referral to [facility] or [facility] for another opinion, and he wished to discuss it at the office today. Regular use of Viberzi has recently control the diarrhea.  Brandon Giles is here with his wife today and glad to report that he continues to slowly improve.  His stools are less frequent with more formed he no longer has nocturnal diarrhea with urgency.  Bloating is improved, still has some gas but overall feels like he is steadily getting better, albeit slowly. His weight is stabilized, and although Lomotil previously made him feel more bloated and uncomfortable, lately it has been working better without the same side effects so he no longer needs the Viberzi.  ROS: Cardiovascular:  no chest pain Respiratory: no dyspnea  The patient's Past Medical, Family and Social History were reviewed and are on file in the EMR.  Objective:  Med list reviewed  Current Outpatient Medications:    amLODipine (NORVASC) 5 MG tablet, Take 5 mg by mouth daily., Disp: , Rfl:    diphenoxylate-atropine (LOMOTIL) 2.5-0.025 MG tablet, Take 1 tablet by mouth 4 (four) times daily as needed for diarrhea or loose stools., Disp: , Rfl:    levothyroxine (SYNTHROID) 50 MCG tablet, Take 50 mcg by mouth daily., Disp: , Rfl:    loratadine (CLARITIN) 10 MG tablet, Take 10 mg by mouth daily., Disp: , Rfl:    omeprazole (PRILOSEC) 40 MG capsule, Take 40 mg by  mouth daily., Disp: , Rfl:    oxybutynin (DITROPAN) 5 MG tablet, Take 5 mg by mouth daily., Disp: , Rfl:    pravastatin (PRAVACHOL) 80 MG tablet, Take 40 mg by mouth daily., Disp: , Rfl:    tadalafil (CIALIS) 5 MG tablet, Take 1 tablet (5 mg total) by mouth daily as needed for erectile dysfunction., Disp: 30 tablet, Rfl: 11   Vital signs in last 24 hrs: Vitals:   10/02/21 0820  BP: 140/72  Pulse: 62   Wt Readings from Last 3 Encounters:  10/02/21 208 lb (94.3 kg)  09/13/21 210 lb (95.3 kg)  06/28/21 212 lb (96.2 kg)    Physical Exam  He is well-appearing with good muscle mass HEENT: sclera anicteric, oral mucosa moist without lesions Neck: supple, no thyromegaly, JVD or lymphadenopathy Cardiac: Regular without murmur,  no peripheral edema Pulm: clear to auscultation bilaterally, normal RR and effort noted Abdomen: soft, no tenderness, with active bowel sounds. No guarding or palpable hepatosplenomegaly. Skin; warm and dry, no jaundice or rash  Labs: Recent C. difficile testing as noted above  ___________________________________________ Radiologic studies:   ____________________________________________ Other: Normal duodenal biopsies on recent EGD.  _____________________________________________ Assessment & Plan  Assessment: Encounter Diagnosis  Name Primary?   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 We discussed the unclear nature of this scenario, and how most cases will slowly improve.  His weight loss has ceased and he  has no discovered malabsorption condition.  We had initially discussed a referral to an academic center, but he asked if we could hold off on that to give it more time since he is improving.  I agree, I refilled his Lomotil and he will keep in touch by portal message or phone call as needed for updates.  22 minutes were spent on this encounter (including chart  review, history/exam, counseling/coordination of care, and documentation) > 50% of that time was spent on counseling and coordination of care.   Nelida Meuse III

## 2021-10-02 NOTE — Patient Instructions (Signed)
If you are age 59 or older, your body mass index should be between 23-30. Your Body mass index is 31.63 kg/m. If this is out of the aforementioned range listed, please consider follow up with your Primary Care Provider.  If you are age 74 or younger, your body mass index should be between 19-25. Your Body mass index is 31.63 kg/m. If this is out of the aformentioned range listed, please consider follow up with your Primary Care Provider.   ________________________________________________________  The [facility] providers would like to encourage you to use [facility] to communicate with providers for non-urgent requests or questions.  Due to long hold times on the telephone, sending your provider a message by [facility] may be a faster and more efficient way to get a response.  Please allow 48 business hours for a response.  Please remember that this is for non-urgent requests.  _______________________________________________________   We will refill your Lomotil.   Follow up as needed.   It was a pleasure to see you today!  Thank you for trusting me with your gastrointestinal care!

## 2021-11-03 ENCOUNTER — Encounter: Payer: Self-pay | Admitting: Gastroenterology

## 2021-11-09 ENCOUNTER — Other Ambulatory Visit: Payer: Self-pay | Admitting: Urology

## 2021-11-09 DIAGNOSIS — N401 Enlarged prostate with lower urinary tract symptoms: Secondary | ICD-10-CM

## 2022-01-16 ENCOUNTER — Other Ambulatory Visit: Payer: Self-pay | Admitting: Urology

## 2022-01-17 ENCOUNTER — Other Ambulatory Visit: Payer: 59

## 2022-01-17 DIAGNOSIS — E291 Testicular hypofunction: Secondary | ICD-10-CM

## 2022-01-17 DIAGNOSIS — R972 Elevated prostate specific antigen [PSA]: Secondary | ICD-10-CM

## 2022-01-18 LAB — PSA, TOTAL AND FREE
PSA, Free Pct: 20.3 %
PSA, Free: 1.46 ng/mL
Prostate Specific Ag, Serum: 7.2 ng/mL — ABNORMAL HIGH (ref 0.0–4.0)

## 2022-01-18 LAB — TESTOSTERONE FREE, PROFILE I
Albumin: 4.7 g/dL (ref 3.8–4.9)
Sex Hormone Binding: 35.8 nmol/L (ref 19.3–76.4)
Testost., Free, Calc: 53.1 pg/mL (ref 35.8–168.2)
Testosterone: 296 ng/dL (ref 264–916)

## 2022-01-24 ENCOUNTER — Encounter: Payer: Self-pay | Admitting: Urology

## 2022-01-24 ENCOUNTER — Ambulatory Visit: Payer: 59 | Admitting: Urology

## 2022-01-24 VITALS — BP 152/75 | HR 71

## 2022-01-24 DIAGNOSIS — N138 Other obstructive and reflux uropathy: Secondary | ICD-10-CM | POA: Diagnosis not present

## 2022-01-24 DIAGNOSIS — E291 Testicular hypofunction: Secondary | ICD-10-CM

## 2022-01-24 DIAGNOSIS — R351 Nocturia: Secondary | ICD-10-CM | POA: Diagnosis not present

## 2022-01-24 DIAGNOSIS — R350 Frequency of micturition: Secondary | ICD-10-CM | POA: Diagnosis not present

## 2022-01-24 DIAGNOSIS — R972 Elevated prostate specific antigen [PSA]: Secondary | ICD-10-CM | POA: Diagnosis not present

## 2022-01-24 DIAGNOSIS — N5201 Erectile dysfunction due to arterial insufficiency: Secondary | ICD-10-CM

## 2022-01-24 DIAGNOSIS — N401 Enlarged prostate with lower urinary tract symptoms: Secondary | ICD-10-CM

## 2022-01-24 LAB — POCT URINALYSIS DIPSTICK
Bilirubin, UA: NEGATIVE
Blood, UA: NEGATIVE
Glucose, UA: NEGATIVE
Ketones, UA: NEGATIVE
Leukocytes, UA: NEGATIVE
Nitrite, UA: NEGATIVE
Protein, UA: NEGATIVE
Spec Grav, UA: 1.015 (ref 1.010–1.025)
Urobilinogen, UA: 0.2 E.U./dL
pH, UA: 7.5 (ref 5.0–8.0)

## 2022-01-24 MED ORDER — SILODOSIN 8 MG PO CAPS: 8.0000 mg | ORAL_CAPSULE | Freq: Every day | ORAL | 11 refills | Status: DC

## 2022-01-24 NOTE — Progress Notes (Signed)
Uroflow  Peak Flow: 65m Average Flow: 116mVoided Volume: 35060moiding Time: 31sec Flow Time: 31sec Time to Peak Flow: 9sec  PVR Volume: 104m22m

## 2022-01-24 NOTE — Progress Notes (Signed)
Patient ID: Brandon Giles, male   DOB: 1960-11-21, 61 y.o.   MRN: [id number]  Subjective:  1. Elevated prostate specific antigen (PSA)   2. BPH with urinary obstruction   3. Nocturia   4. Urinary frequency   5. Erectile dysfunction due to arterial insufficiency   6. Primary hypogonadism in male        12/2821: Brandon Giles returns today in f/u for his history of BPH with BOO and LUTS and an elevated PSA with a negative biopsy in 2020.  He had an MRIP in 2/22 that had no high risk changes and a volume of 72m.   His PSA prior to this visit is 7.2 with a 20% f/t ratio.  The level is stable since 11/21.  He is on alfuzosin and oxybutynin at bedtime.  He continues to use tadalafil '5mg'$  daily which has been helping his nocturia.  He has sildenafil for his ED but he rarely takes.  He will use 3 tabs with the tadalafil.  He has a history of hypogonadism and his T was 296 on 9/21 with a free T of 53.  He is no longer on clomid or TRT.  His IPSS is 14 which is markedly improved.  He is dealing recently with chronic diarrhea and is being evaluated by GI.   JMerry Prouddid a flow rate today with a PF of 13msec and a MF of 1063mec with 350m40mided.  HIs PVR is 104ml31me continues to have some issues with nocturia and is better with evening fluid restriction but doesn't feel he empties well.    A few days ago, he had some sharp pain in his testes that lasted for about 2 hours.  He noted a little black dot on the scrotum.     IPSS     Row Name 01/24/22 1000         International Prostate Symptom Score   How often have you had the sensation of not emptying your bladder? More than half the time     How often have you had to urinate less than every two hours? More than half the time     How often have you found you stopped and started again several times when you urinated? About half the time     How often have you found it difficult to postpone urination? More than half the time     How often have you had a  weak urinary stream? About half the time     How often have you had to strain to start urination? About half the time     How many times did you typically get up at night to urinate? 5 Times     Total IPSS Score 26       Quality of Life due to urinary symptoms   If you were to spend the rest of your life with your urinary condition just the way it is now how would you feel about that? Mostly Satisfied                ROS:  ROS:  A complete review of systems was performed.  All systems are negative except for pertinent findings as noted.   Review of Systems  HENT:  Positive for sinus pain.   Musculoskeletal:  Positive for back pain.  All other systems reviewed and are negative.   Allergies  Allergen Reactions   Hydrocodone-Acetaminophen Other (See Comments)    Doesn't like how it makes  him feel-dizzy,tachycardic    Outpatient Encounter Medications as of 01/24/2022  Medication Sig   silodosin (RAPAFLO) 8 MG CAPS capsule Take 1 capsule (8 mg total) by mouth daily with breakfast.   amLODipine (NORVASC) 5 MG tablet Take 5 mg by mouth daily.   levothyroxine (SYNTHROID) 50 MCG tablet Take 50 mcg by mouth daily.   loratadine (CLARITIN) 10 MG tablet Take 10 mg by mouth daily.   omeprazole (PRILOSEC) 40 MG capsule Take 40 mg by mouth daily.   oxybutynin (DITROPAN) 5 MG tablet TAKE 1 TABLET BY MOUTH AT  BEDTIME   pravastatin (PRAVACHOL) 80 MG tablet Take 40 mg by mouth daily.   tadalafil (CIALIS) 5 MG tablet Take 1 tablet (5 mg total) by mouth daily as needed for erectile dysfunction.   [DISCONTINUED] alfuzosin (UROXATRAL) 10 MG 24 hr tablet TAKE 1 TABLET BY MOUTH  DAILY WITH BREAKFAST   [DISCONTINUED] diphenoxylate-atropine (LOMOTIL) 2.5-0.025 MG tablet Take 1 tablet by mouth 4 (four) times daily as needed for diarrhea or loose stools.   No facility-administered encounter medications on file as of 01/24/2022.    Past Medical History:  Diagnosis Date   Allergy    seasonal   HOH  (hard of hearing)    left ear   Hypertension    PONV (postoperative nausea and vomiting)    after hernia repair   Shortness of breath    with exercise-states 'out of shape'   Sleep apnea    CPAP    Past Surgical History:  Procedure Laterality Date   COLONOSCOPY  07/15/2013   Dr.Brodie   GANGLION CYST EXCISION  12/25/2011   Procedure: REMOVAL GANGLION OF WRIST;  Surgeon: Magnus Sinning, MD;  Location: [facility];  Service: Orthopedics;  Laterality: Left;  excision of ganglion cyst of left wrist    HERNIA REPAIR  mid '90's   bilateral ing   KNEE ARTHROSCOPY  '99   left    NASAL SEPTOPLASTY W/ TURBINOPLASTY Bilateral 06/04/2013   Procedure: BILATERNAL NASAL SEPTOPLASTY WITH TURBINATE REDUCTION;  Surgeon: Izora Gala, MD;  Location: [place];  Service: ENT;  Laterality: Bilateral;   SEPTOPLASTY     TONSILLECTOMY      Social History   Socioeconomic History   Marital status: Married    Spouse name: Not on file   Number of children: Not on file   Years of education: Not on file   Highest education level: Not on file  Occupational History   Occupation: Retired  Tobacco Use   Smoking status: Never   Smokeless tobacco: Never  Vaping Use   Vaping Use: Never used  Substance and Sexual Activity   Alcohol use: No   Drug use: Yes   Sexual activity: Not on file  Other Topics Concern   Not on file  Social History Narrative   Not on file   Social Determinants of Health   Financial Resource Strain: Not on file  Food Insecurity: Not on file  Transportation Needs: Not on file  Physical Activity: Not on file  Stress: Not on file  Social Connections: Not on file  Intimate Partner Violence: Not on file    Family History  Problem Relation Age of Onset   Heart disease Mother    Lung cancer Mother    Stroke Mother    Diabetes Father    Colon cancer Neg Hx    Esophageal cancer Neg Hx    Rectal cancer Neg Hx    Stomach  cancer Neg Hx     Colon polyps Neg Hx        Objective: Vitals:   01/24/22 1037  BP: (!) 152/75  Pulse: 71     Physical Exam Vitals reviewed.  Constitutional:      Appearance: Normal appearance.  Genitourinary:    Comments: AP without lesions. Prostate 2+ benign. SV non-palpable Neurological:     Mental Status: He is alert.     Lab Results:  PSA No results found for: "PSA" Testosterone  Date Value Ref Range Status  01/17/2022 296 264 - 916 ng/dL Final    Comment:    Adult male reference interval is based on a population of healthy nonobese males (BMI <30) between 26 and 5 years old. [place], [place] [phone]. PMID: 54270623. **Verified by repeat analysis**   04/05/2021 340 264 - 916 ng/dL Final    Comment:    Adult male reference interval is based on a population of healthy nonobese males (BMI <30) between 64 and 32 years old. [place], [place] [phone]. PMID: 71062694.   03/20/2020 383 264 - 916 ng/dL Final    Comment:    Adult male reference interval is based on a population of healthy nonobese males (BMI <30) between 67 and 25 years old. [place], [place] [phone]. PMID: 82993716.    Lab Results  Component Value Date   PSA1 7.2 (H) 01/17/2022    Lab Results  Component Value Date   HCT 45.6 06/26/2021   HCT 46.5 03/20/2020   Lab Results  Component Value Date   HGB 15.6 06/26/2021   HGB 15.7 03/20/2020    UA is unremarkable today.   Studies/Results:     Assessment & Plan: Elevated PSA.   His PSA remains stable. I am going to have him return with a PSA in 6 months.    BPH with BOO with OAB.   Continue daily tadalafil and oxybutynin.   I will change the alfuzosin on silodosin to see if that helps the nocturia.   If it doesn't, he may want to consider a Urolift.  I have reviewed the options including Urolift, Rezum, PAE and TURP.       ED. Contine daily tadalafil with prn Sildenafil  Hypogonadism.  Testosterone  is borderline but the free T is normal.      Meds ordered this encounter  Medications   silodosin (RAPAFLO) 8 MG CAPS capsule    Sig: Take 1 capsule (8 mg total) by mouth daily with breakfast.    Dispense:  30 capsule    Refill:  11      Orders Placed This Encounter  Procedures   PSA, total and free    Standing Status:   Future    Standing Expiration Date:   01/25/2023   POCT urinalysis dipstick      Return in about 6 months (around 07/25/2022) for with PSA.   CC: Redmond School, MD      Irine Seal 01/24/2022 Patient ID: Inocencio Homes, male   DOB: [birth year], 61 y.o.   MRN: [id number]

## 2022-02-27 DIAGNOSIS — J40 Bronchitis, not specified as acute or chronic: Secondary | ICD-10-CM

## 2022-02-27 HISTORY — DX: Bronchitis, not specified as acute or chronic: J40

## 2022-03-15 ENCOUNTER — Ambulatory Visit: Payer: Self-pay | Admitting: Surgery
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
## 2022-03-25 NOTE — Progress Notes (Signed)
COVID Vaccine Completed:  Date of COVID positive in last 90 days:  PCP - Redmond School, MD Cardiologist - Carlyle Dolly, MD (last OV 2020, no cardiac issues)  Chest x-ray -  EKG -  Stress Test -  ECHO -  Cardiac Cath -  Pacemaker/ICD device last checked: Spinal Cord Stimulator:  Bowel Prep -   Sleep Study - Yes, +sleep apnea CPAP -   Fasting Blood Sugar -  Checks Blood Sugar _____ times a day  Last dose of GLP1 agonist-  N/A GLP1 instructions:  N/A   Last dose of SGLT-2 inhibitors-  N/A SGLT-2 instructions: N/A   Blood Thinner Instructions: Aspirin Instructions: Last Dose:  Activity level:  Can go up a flight of stairs and perform activities of daily living without stopping and without symptoms of chest pain or shortness of breath.  Able to exercise without symptoms  Unable to go up a flight of stairs without symptoms of     Anesthesia review:   Patient denies shortness of breath, fever, cough and chest pain at PAT appointment  Patient verbalized understanding of instructions that were given to them at the PAT appointment. Patient was also instructed that they will need to review over the PAT instructions again at home before surgery.

## 2022-03-25 NOTE — Patient Instructions (Signed)
SURGICAL WAITING ROOM VISITATION Patients having surgery or a procedure may have no more than 2 support people in the waiting area - these visitors may rotate.   Children under the age of 8 must have an adult with them who is not the patient. If the patient needs to stay at the hospital during part of their recovery, the visitor guidelines for inpatient rooms apply. Pre-op nurse will coordinate an appropriate time for 1 support person to accompany patient in pre-op.  This support person may not rotate.    Please refer to the [facility] website for the visitor guidelines for Inpatients (after your surgery is over and you are in a regular room).      Your procedure is scheduled on: 04-09-22   Report to [facility] Main Entrance    Report to admitting at 12:15 PM   Call this number if you have problems the morning of surgery [phone]   Do not eat food or drink liquids:After Midnight.          If you have questions, please contact your surgeon's office.   FOLLOW ANY ADDITIONAL PRE OP INSTRUCTIONS YOU RECEIVED FROM YOUR SURGEON'S OFFICE!!!     Oral Hygiene is also important to reduce your risk of infection.                                    Remember - BRUSH YOUR TEETH THE MORNING OF SURGERY WITH YOUR REGULAR TOOTHPASTE   Do NOT smoke after Midnight   Take these medicines the morning of surgery with A SIP OF WATER:   Alfuzosin  Amlodipine  Amoxicillin  Cetirizine  Levothyroxine  Omeprazole  Pravastatin  DO NOT TAKE ANY ORAL DIABETIC MEDICATIONS DAY OF YOUR SURGERY  Bring CPAP mask and tubing day of surgery.                              You may not have any metal on your body including jewelry, and body piercing             Do not wear lotions, powders, cologne, or deodorant              Men may shave face and neck.   Do not bring valuables to the hospital. [place].   Contacts, dentures or bridgework may not be worn  into surgery.  DO NOT [place]. PHARMACY WILL DISPENSE MEDICATIONS LISTED ON YOUR MEDICATION LIST TO YOU DURING YOUR ADMISSION [place]!    Patients discharged on the day of surgery will not be allowed to drive home.  Someone NEEDS to stay with you for the first 24 hours after anesthesia.   Special Instructions: Bring a copy of your healthcare power of attorney and living will documents the day of surgery if you haven't scanned them before.              Please read over the following fact sheets you were given: IF [place]  If you received a COVID test during your pre-op visit  it is requested that you wear a mask when out in public, stay away from anyone that may not be feeling well and notify your surgeon if you develop symptoms. If you test  positive for Covid or have been in contact with anyone that has tested positive in the last 10 days please notify you surgeon.  [place] - Preparing for Surgery Before surgery, you can play an important role.  Because skin is not sterile, your skin needs to be as free of germs as possible.  You can reduce the number of germs on your skin by washing with CHG (chlorahexidine gluconate) soap before surgery.  CHG is an antiseptic cleaner which kills germs and bonds with the skin to continue killing germs even after washing. Please DO NOT use if you have an allergy to CHG or antibacterial soaps.  If your skin becomes reddened/irritated stop using the CHG and inform your nurse when you arrive at Short Stay. Do not shave (including legs and underarms) for at least 48 hours prior to the first CHG shower.  You may shave your face/neck.  Please follow these instructions carefully:  1.  Shower with CHG Soap the night before surgery and the  morning of surgery.  2.  If you choose to wash your hair, wash your hair first as usual with your normal   shampoo.  3.  After you shampoo, rinse your hair and body thoroughly to remove the shampoo.                             4.  Use CHG as you would any other liquid soap.  You can apply chg directly to the skin and wash.  Gently with a scrungie or clean washcloth.  5.  Apply the CHG Soap to your body ONLY FROM THE NECK DOWN.   Do   not use on face/ open                           Wound or open sores. Avoid contact with eyes, ears mouth and   genitals (private parts).                       Wash face,  Genitals (private parts) with your normal soap.             6.  Wash thoroughly, paying special attention to the area where your    surgery  will be performed.  7.  Thoroughly rinse your body with warm water from the neck down.  8.  DO NOT shower/wash with your normal soap after using and rinsing off the CHG Soap.                9.  Pat yourself dry with a clean towel.            10.  Wear clean pajamas.            11.  Place clean sheets on your bed the night of your first shower and do not  sleep with pets. Day of Surgery : Do not apply any lotions/deodorants the morning of surgery.  Please wear clean clothes to the hospital/surgery center.  FAILURE TO FOLLOW THESE INSTRUCTIONS MAY RESULT IN THE CANCELLATION OF YOUR SURGERY  PATIENT SIGNATURE_________________________________  NURSE SIGNATURE__________________________________  ________________________________________________________________________

## 2022-03-27 ENCOUNTER — Other Ambulatory Visit: Payer: Self-pay

## 2022-03-27 ENCOUNTER — Encounter (HOSPITAL_COMMUNITY)
Admission: RE | Admit: 2022-03-27 | Discharge: 2022-03-27 | Disposition: A | Discharge status: Home / Self Care | Payer: 59 | Source: Ambulatory Visit | Attending: Surgery | Admitting: Surgery

## 2022-03-27 ENCOUNTER — Encounter (HOSPITAL_COMMUNITY): Payer: Self-pay

## 2022-03-27 VITALS — BP 148/94 | HR 74 | Temp 98.4°F | Resp 18 | Ht 68.0 in | Wt 207.8 lb

## 2022-03-27 DIAGNOSIS — I2510 Atherosclerotic heart disease of native coronary artery without angina pectoris: Secondary | ICD-10-CM | POA: Insufficient documentation

## 2022-03-27 DIAGNOSIS — R7303 Prediabetes: Secondary | ICD-10-CM | POA: Diagnosis not present

## 2022-03-27 DIAGNOSIS — Z01818 Encounter for other preprocedural examination: Secondary | ICD-10-CM | POA: Diagnosis present

## 2022-03-27 HISTORY — DX: Gastro-esophageal reflux disease without esophagitis: K21.9

## 2022-03-27 HISTORY — DX: Unspecified osteoarthritis, unspecified site: M19.90

## 2022-03-27 HISTORY — DX: Prediabetes: R73.03

## 2022-03-27 LAB — BASIC METABOLIC PANEL
Anion gap: 7 (ref 5–15)
BUN: 22 mg/dL — ABNORMAL HIGH (ref 6–20)
CO2: 24 mmol/L (ref 22–32)
Calcium: 8.7 mg/dL — ABNORMAL LOW (ref 8.9–10.3)
Chloride: 106 mmol/L (ref 98–111)
Creatinine, Ser: 1.23 mg/dL (ref 0.61–1.24)
GFR, Estimated: >60 mL/min (ref >60)
Glucose, Bld: 106 mg/dL — ABNORMAL HIGH (ref 70–99)
Potassium: 4.1 mmol/L (ref 3.5–5.1)
Sodium: 137 mmol/L (ref 135–145)

## 2022-03-27 LAB — GLUCOSE, CAPILLARY: Glucose-Capillary: 106 mg/dL — ABNORMAL HIGH (ref 70–99)

## 2022-03-28 LAB — HEMOGLOBIN A1C
Hgb A1c MFr Bld: 6.6 % — ABNORMAL HIGH (ref 4.8–5.6)
Mean Plasma Glucose: 143 mg/dL

## 2022-04-01 ENCOUNTER — Other Ambulatory Visit: Payer: Self-pay | Admitting: Urology

## 2022-04-09 ENCOUNTER — Encounter (HOSPITAL_COMMUNITY): Payer: Self-pay | Admitting: Surgery

## 2022-04-09 ENCOUNTER — Other Ambulatory Visit: Payer: Self-pay

## 2022-04-09 ENCOUNTER — Encounter (HOSPITAL_COMMUNITY)
Admission: RE | Disposition: A | Discharge status: Home / Self Care | Payer: Self-pay | Source: Home / Self Care | Attending: Surgery

## 2022-04-09 ENCOUNTER — Ambulatory Visit (HOSPITAL_COMMUNITY)
Admission: RE | Admit: 2022-04-09 | Discharge: 2022-04-09 | Disposition: A | Discharge status: Home / Self Care | Payer: 59 | Attending: Surgery | Admitting: Surgery

## 2022-04-09 ENCOUNTER — Ambulatory Visit (HOSPITAL_BASED_OUTPATIENT_CLINIC_OR_DEPARTMENT_OTHER): Payer: 59 | Admitting: Anesthesiology

## 2022-04-09 ENCOUNTER — Ambulatory Visit (HOSPITAL_COMMUNITY): Payer: 59 | Admitting: Anesthesiology

## 2022-04-09 DIAGNOSIS — K4021 Bilateral inguinal hernia, without obstruction or gangrene, recurrent: Secondary | ICD-10-CM | POA: Diagnosis not present

## 2022-04-09 DIAGNOSIS — K219 Gastro-esophageal reflux disease without esophagitis: Secondary | ICD-10-CM | POA: Diagnosis not present

## 2022-04-09 DIAGNOSIS — K4020 Bilateral inguinal hernia, without obstruction or gangrene, not specified as recurrent: Secondary | ICD-10-CM | POA: Diagnosis not present

## 2022-04-09 DIAGNOSIS — E039 Hypothyroidism, unspecified: Secondary | ICD-10-CM | POA: Insufficient documentation

## 2022-04-09 DIAGNOSIS — Z6831 Body mass index (BMI) 31.0-31.9, adult: Secondary | ICD-10-CM | POA: Diagnosis not present

## 2022-04-09 DIAGNOSIS — I10 Essential (primary) hypertension: Secondary | ICD-10-CM | POA: Diagnosis not present

## 2022-04-09 DIAGNOSIS — E669 Obesity, unspecified: Secondary | ICD-10-CM | POA: Insufficient documentation

## 2022-04-09 DIAGNOSIS — K4091 Unilateral inguinal hernia, without obstruction or gangrene, recurrent: Secondary | ICD-10-CM | POA: Diagnosis present

## 2022-04-09 SURGERY — HERNIORRHAPHY, INGUINAL, ROBOT-ASSISTED, LAPAROSCOPIC
Anesthesia: General | Site: Abdomen | Laterality: Left

## 2022-04-09 MED ORDER — FENTANYL CITRATE PF 50 MCG/ML IJ SOSY: 25.0000 ug | PREFILLED_SYRINGE | INTRAMUSCULAR | Status: DC | PRN

## 2022-04-09 MED ORDER — PROPOFOL 500 MG/50ML IV EMUL
INTRAVENOUS | Status: DC | PRN
  Administered 2022-04-09: 30 ug/kg/min via INTRAVENOUS

## 2022-04-09 MED ORDER — LACTATED RINGERS IV SOLN: INTRAVENOUS | Status: DC | PRN

## 2022-04-09 MED ORDER — GABAPENTIN 300 MG PO CAPS
300.0000 mg | ORAL_CAPSULE | ORAL | Status: AC
  Administered 2022-04-09: 300 mg via ORAL
  Filled 2022-04-09: qty 1

## 2022-04-09 MED ORDER — LACTATED RINGERS IV SOLN: INTRAVENOUS | Status: DC

## 2022-04-09 MED ORDER — ACETAMINOPHEN 325 MG PO TABS: 650.0000 mg | ORAL_TABLET | ORAL | Status: DC | PRN

## 2022-04-09 MED ORDER — TRAMADOL HCL 50 MG PO TABS: 50.0000 mg | ORAL_TABLET | Freq: Four times a day (QID) | ORAL | 0 refills | Status: AC | PRN

## 2022-04-09 MED ORDER — DEXAMETHASONE SODIUM PHOSPHATE 4 MG/ML IJ SOLN
INTRAMUSCULAR | Status: DC | PRN
  Administered 2022-04-09: 10 mg via INTRAVENOUS

## 2022-04-09 MED ORDER — LIDOCAINE HCL (PF) 2 % IJ SOLN
INTRAMUSCULAR | Status: AC
  Filled 2022-04-09: qty 5

## 2022-04-09 MED ORDER — PROMETHAZINE HCL 25 MG/ML IJ SOLN: 6.2500 mg | INTRAMUSCULAR | Status: DC | PRN

## 2022-04-09 MED ORDER — SODIUM CHLORIDE 0.9 % IV SOLN: 250.0000 mL | INTRAVENOUS | Status: DC | PRN

## 2022-04-09 MED ORDER — CHLORHEXIDINE GLUCONATE 4 % EX LIQD: 60.0000 mL | Freq: Once | CUTANEOUS | Status: DC

## 2022-04-09 MED ORDER — SODIUM CHLORIDE 0.9% FLUSH: 3.0000 mL | Freq: Two times a day (BID) | INTRAVENOUS | Status: DC

## 2022-04-09 MED ORDER — SCOPOLAMINE 1 MG/3DAYS TD PT72
1.0000 | MEDICATED_PATCH | Freq: Once | TRANSDERMAL | Status: DC
  Administered 2022-04-09: 1.5 mg via TRANSDERMAL
  Filled 2022-04-09: qty 1

## 2022-04-09 MED ORDER — BUPIVACAINE LIPOSOME 1.3 % IJ SUSP: 20.0000 mL | Freq: Once | INTRAMUSCULAR | Status: DC

## 2022-04-09 MED ORDER — BUPIVACAINE-EPINEPHRINE (PF) 0.5% -1:200000 IJ SOLN
INTRAMUSCULAR | Status: AC
  Filled 2022-04-09: qty 30

## 2022-04-09 MED ORDER — CHLORHEXIDINE GLUCONATE 0.12 % MT SOLN
15.0000 mL | Freq: Once | OROMUCOSAL | Status: AC
  Administered 2022-04-09: 15 mL via OROMUCOSAL

## 2022-04-09 MED ORDER — ONDANSETRON HCL 4 MG/2ML IJ SOLN
INTRAMUSCULAR | Status: DC | PRN
  Administered 2022-04-09: 4 mg via INTRAVENOUS

## 2022-04-09 MED ORDER — ACETAMINOPHEN 500 MG PO TABS
1000.0000 mg | ORAL_TABLET | ORAL | Status: AC
  Administered 2022-04-09: 1000 mg via ORAL
  Filled 2022-04-09: qty 2

## 2022-04-09 MED ORDER — LIDOCAINE 2% (20 MG/ML) 5 ML SYRINGE
INTRAMUSCULAR | Status: DC | PRN
  Administered 2022-04-09: 80 mg via INTRAVENOUS

## 2022-04-09 MED ORDER — MIDAZOLAM HCL 2 MG/2ML IJ SOLN
INTRAMUSCULAR | Status: AC
  Filled 2022-04-09: qty 2

## 2022-04-09 MED ORDER — PROPOFOL 10 MG/ML IV BOLUS
INTRAVENOUS | Status: DC | PRN
  Administered 2022-04-09: 180 mg via INTRAVENOUS

## 2022-04-09 MED ORDER — SUGAMMADEX SODIUM 200 MG/2ML IV SOLN
INTRAVENOUS | Status: DC | PRN
  Administered 2022-04-09: 200 mg via INTRAVENOUS

## 2022-04-09 MED ORDER — BUPIVACAINE LIPOSOME 1.3 % IJ SUSP
INTRAMUSCULAR | Status: AC
  Filled 2022-04-09: qty 20

## 2022-04-09 MED ORDER — ROCURONIUM BROMIDE 10 MG/ML (PF) SYRINGE
PREFILLED_SYRINGE | INTRAVENOUS | Status: DC | PRN
  Administered 2022-04-09: 70 mg via INTRAVENOUS

## 2022-04-09 MED ORDER — BUPIVACAINE-EPINEPHRINE 0.5% -1:200000 IJ SOLN
INTRAMUSCULAR | Status: DC | PRN
  Administered 2022-04-09: 30 mL

## 2022-04-09 MED ORDER — 0.9 % SODIUM CHLORIDE (POUR BTL) OPTIME
TOPICAL | Status: DC | PRN
  Administered 2022-04-09: 1000 mL

## 2022-04-09 MED ORDER — MIDAZOLAM HCL 5 MG/5ML IJ SOLN
INTRAMUSCULAR | Status: DC | PRN
  Administered 2022-04-09: 2 mg via INTRAVENOUS

## 2022-04-09 MED ORDER — ORAL CARE MOUTH RINSE: 15.0000 mL | Freq: Once | OROMUCOSAL | Status: AC

## 2022-04-09 MED ORDER — OXYCODONE HCL 5 MG PO TABS: 5.0000 mg | ORAL_TABLET | ORAL | Status: DC | PRN

## 2022-04-09 MED ORDER — DOCUSATE SODIUM 100 MG PO CAPS: 100.0000 mg | ORAL_CAPSULE | Freq: Two times a day (BID) | ORAL | 0 refills | Status: AC

## 2022-04-09 MED ORDER — CEFAZOLIN SODIUM-DEXTROSE 2-4 GM/100ML-% IV SOLN
2.0000 g | INTRAVENOUS | Status: AC
  Administered 2022-04-09: 2 g via INTRAVENOUS
  Filled 2022-04-09: qty 100

## 2022-04-09 MED ORDER — FENTANYL CITRATE (PF) 100 MCG/2ML IJ SOLN
INTRAMUSCULAR | Status: AC
  Filled 2022-04-09: qty 2

## 2022-04-09 MED ORDER — SODIUM CHLORIDE 0.9% FLUSH: 3.0000 mL | INTRAVENOUS | Status: DC | PRN

## 2022-04-09 MED ORDER — FENTANYL CITRATE (PF) 250 MCG/5ML IJ SOLN
INTRAMUSCULAR | Status: DC | PRN
  Administered 2022-04-09 (×2): 50 ug via INTRAVENOUS
  Administered 2022-04-09: 100 ug via INTRAVENOUS

## 2022-04-09 MED ORDER — ROCURONIUM BROMIDE 10 MG/ML (PF) SYRINGE
PREFILLED_SYRINGE | INTRAVENOUS | Status: AC
  Filled 2022-04-09: qty 10

## 2022-04-09 MED ORDER — DEXAMETHASONE SODIUM PHOSPHATE 10 MG/ML IJ SOLN
INTRAMUSCULAR | Status: AC
  Filled 2022-04-09: qty 1

## 2022-04-09 MED ORDER — ACETAMINOPHEN 650 MG RE SUPP: 650.0000 mg | RECTAL | Status: DC | PRN

## 2022-04-09 MED ORDER — BUPIVACAINE LIPOSOME 1.3 % IJ SUSP
INTRAMUSCULAR | Status: DC | PRN
  Administered 2022-04-09: 20 mL

## 2022-04-09 MED ORDER — PROPOFOL 500 MG/50ML IV EMUL
INTRAVENOUS | Status: AC
  Filled 2022-04-09: qty 100

## 2022-04-09 SURGICAL SUPPLY — 50 items
ANTIFOG SOL W/FOAM PAD STRL (MISCELLANEOUS) ×1
APL PRP STRL LF DISP 70% ISPRP (MISCELLANEOUS) ×1
APL SWBSTK 6 STRL LF DISP (MISCELLANEOUS) ×2
APPLICATOR COTTON TIP 6 STRL (MISCELLANEOUS) ×4 IMPLANT
APPLICATOR COTTON TIP 6IN STRL (MISCELLANEOUS) ×2
BAG COUNTER SPONGE SURGICOUNT (BAG) IMPLANT
BAG SPNG CNTER NS LX DISP (BAG)
BLADE SURG SZ11 CARB STEEL (BLADE) ×2 IMPLANT
CHLORAPREP W/TINT 26 (MISCELLANEOUS) ×2 IMPLANT
COVER SURGICAL LIGHT HANDLE (MISCELLANEOUS) ×2 IMPLANT
COVER TIP SHEARS 8 DVNC (MISCELLANEOUS) ×2 IMPLANT
COVER TIP SHEARS 8MM DA VINCI (MISCELLANEOUS) ×1
DRAPE ARM DVNC X/XI (DISPOSABLE) ×8 IMPLANT
DRAPE COLUMN DVNC XI (DISPOSABLE) ×2 IMPLANT
DRAPE DA VINCI XI ARM (DISPOSABLE) ×4
DRAPE DA VINCI XI COLUMN (DISPOSABLE) ×1
ELECT REM PT RETURN 15FT ADLT (MISCELLANEOUS) ×2 IMPLANT
GLOVE BIO SURGEON STRL SZ 6 (GLOVE) ×4 IMPLANT
GLOVE INDICATOR 6.5 STRL GRN (GLOVE) ×4 IMPLANT
GLOVE SS BIOGEL STRL SZ 6 (GLOVE) ×2 IMPLANT
GOWN STRL REUS W/ TWL LRG LVL3 (GOWN DISPOSABLE) ×4 IMPLANT
GOWN STRL REUS W/ TWL XL LVL3 (GOWN DISPOSABLE) IMPLANT
GOWN STRL REUS W/TWL LRG LVL3 (GOWN DISPOSABLE) ×2
GOWN STRL REUS W/TWL XL LVL3 (GOWN DISPOSABLE)
IRRIG SUCT STRYKERFLOW 2 WTIP (MISCELLANEOUS)
IRRIGATION SUCT STRKRFLW 2 WTP (MISCELLANEOUS) IMPLANT
KIT BASIN OR (CUSTOM PROCEDURE TRAY) ×2 IMPLANT
KIT TURNOVER KIT A (KITS) IMPLANT
MESH 3DMAX MID 4X6 LT LRG (Mesh General) IMPLANT
MESH 3DMAX MID 4X6 RT LRG (Mesh General) IMPLANT
NDL INSUFFLATION 14GA 120MM (NEEDLE) ×2 IMPLANT
NEEDLE HYPO 22GX1.5 SAFETY (NEEDLE) ×2 IMPLANT
NEEDLE INSUFFLATION 14GA 120MM (NEEDLE) ×1 IMPLANT
PACK CARDIOVASCULAR III (CUSTOM PROCEDURE TRAY) ×2 IMPLANT
PAD POSITIONING PINK XL (MISCELLANEOUS) ×2 IMPLANT
SEAL CANN UNIV 5-8 DVNC XI (MISCELLANEOUS) ×6 IMPLANT
SEAL XI 5MM-8MM UNIVERSAL (MISCELLANEOUS) ×3
SOLUTION ANTFG W/FOAM PAD STRL (MISCELLANEOUS) ×2 IMPLANT
SOLUTION ELECTROLUBE (MISCELLANEOUS) ×2 IMPLANT
SPIKE FLUID TRANSFER (MISCELLANEOUS) ×2 IMPLANT
SUT MNCRL AB 4-0 PS2 18 (SUTURE) ×2 IMPLANT
SUT VIC AB 3-0 SH 27 (SUTURE) ×1
SUT VIC AB 3-0 SH 27XBRD (SUTURE) ×2 IMPLANT
SUT VLOC 180 2-0 6IN GS21 (SUTURE) ×2 IMPLANT
SUT VLOC 3-0 9IN GRN (SUTURE) IMPLANT
SYR 10ML LL (SYRINGE) ×2 IMPLANT
SYR 20ML LL LF (SYRINGE) ×2 IMPLANT
TOWEL OR 17X26 10 PK STRL BLUE (TOWEL DISPOSABLE) ×2 IMPLANT
TOWEL OR NON WOVEN STRL DISP B (DISPOSABLE) ×2 IMPLANT
TUBING INSUFFLATION 10FT LAP (TUBING) ×2 IMPLANT

## 2022-04-09 NOTE — Interval H&P Note (Signed)
History and Physical Interval Note:  04/09/2022 9:32 AM  Brandon Giles  has presented today for surgery, with the diagnosis of INGUINAL HERNIA.  The various methods of treatment have been discussed with the patient and family. After consideration of risks, benefits and other options for treatment, the patient has consented to  Procedure(s) with comments: XI Carnot-Moon (Left) - 2.5 HRS as a surgical intervention.  The patient's history has been reviewed, patient examined, no change in status, stable for surgery.  I have reviewed the patient's chart and labs.  Questions were answered to the patient's satisfaction.     Barnie Sopko Rich Brave

## 2022-04-09 NOTE — Anesthesia Procedure Notes (Signed)
Procedure Name: Intubation Date/Time: 04/09/2022 10:39 AM  Performed by: [place] Manus Rudd, CRNAPre-anesthesia Checklist: Patient identified, Emergency Drugs available, Suction available and Patient being monitored Patient Re-evaluated:Patient Re-evaluated prior to induction Oxygen Delivery Method: Circle system utilized Preoxygenation: Pre-oxygenation with 100% oxygen Induction Type: IV induction Ventilation: Mask ventilation without difficulty Laryngoscope Size: Mac and 4 Grade View: Grade I Tube type: Oral Tube size: 7.5 mm Number of attempts: 1 Airway Equipment and Method: Stylet and Oral airway Placement Confirmation: ETT inserted through vocal cords under direct vision, positive ETCO2 and breath sounds checked- equal and bilateral Secured at: 21 cm Tube secured with: Tape Dental Injury: Teeth and Oropharynx as per pre-operative assessment

## 2022-04-09 NOTE — Anesthesia Postprocedure Evaluation (Signed)
Anesthesia Post Note  Patient: Brandon Giles  Procedure(s) Performed: XI ROBOTIC ASSISTED BILATERAL INGUINAL HERNIA REPAIR WITH MESH (Left: Abdomen)     Patient location during evaluation: PACU Anesthesia Type: General Level of consciousness: awake and alert Pain management: pain level controlled Vital Signs Assessment: post-procedure vital signs reviewed and stable Respiratory status: spontaneous breathing, nonlabored ventilation, respiratory function stable and patient connected to nasal cannula oxygen Cardiovascular status: blood pressure returned to baseline and stable Postop Assessment: no apparent nausea or vomiting Anesthetic complications: no  No notable events documented.  Last Vitals:  Vitals:   04/09/22 1345 04/09/22 1415  BP:  (!) 160/65  Pulse:    Resp:  16  Temp: 36.5 C   SpO2:      Last Pain:  Vitals:   04/09/22 1415  PainSc: 0-No pain                 Santa Lighter

## 2022-04-09 NOTE — Transfer of Care (Signed)
Immediate Anesthesia Transfer of Care Note  Patient: Brandon Giles  Procedure(s) Performed: XI ROBOTIC ASSISTED BILATERAL INGUINAL HERNIA REPAIR WITH MESH (Left: Abdomen)  Patient Location: PACU  Anesthesia Type:General  Level of Consciousness: awake and alert   Airway & Oxygen Therapy: Patient Spontanous Breathing and Patient connected to face mask oxygen  Post-op Assessment: Report given to RN and Post -op Vital signs reviewed and stable  Post vital signs: Reviewed and stable  Last Vitals:  Vitals Value Taken Time  BP 159/78 04/09/22 1247  Temp 36.5 C 04/09/22 1247  Pulse 70 04/09/22 1251  Resp 20 04/09/22 1251  SpO2 100 % 04/09/22 1251  Vitals shown include unvalidated device data.  Last Pain:  Vitals:   04/09/22 0929  PainSc: 0-No pain      Patients Stated Pain Goal: 3 (85/50/15 8682)  Complications: No notable events documented.

## 2022-04-09 NOTE — Op Note (Signed)
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 The abdomen was clipped, prepped and draped in usual sterile fashion. Peritoneal access was gained with a periumbilical Veress needle and insufflation to 15 mmHg ensued without incident. 5m robotic trocar and camera were inserted. The abdomen was inspected and confirmed to be free of any injury from our entry or gross abnormalities. The patient was then placed in Trendelenburg and the pelvis inspected.  There was a moderate direct hernia on the left and what appeared to be a small indirect hernia on the right.  Bilateral laparoscopic assisted taps blocks were performed with exparel mixed with 0.5% marcaine with epinephrine. Under direct visualization, bilateral 8 mm trocars were inserted after infiltration with local. The robot was then docked and instruments inserted under direct visualization.  Beginning with the left side, the peritoneal flap was developed using sharp and cautery dissection spanning from the anterior superior iliac spine to the medial umbilical ligament. The flap was bluntly dissected off the anterior abdominal wall, developing the preperitoneal plane and space of Retzius until the hernia sac was reduced, the Cooper's ligament and pubic  symphysis exposed medially and sufficient room for the mesh was created inferiorly with exposure of the iliac vessels. The cord structures were peritonealized and carefully protected.  The hernia was a direct hernia with incarcerated preperitoneal fat/medial umbilical ligament and was completely reduced.  Hemostasis was confirmed in the dissection field. A Bard 3D max mid weight left-sided mesh was inserted and noted to have excellent coverage of the direct, indirect, and femoral spaces. The mesh was secured to the Cooper's ligament and superiorly on either side of the inferior epigastric vessels using simple interrupted 3-0 Vicryl's. The peritoneal flap was then brought back up to completely cover the mesh, ensuring that the mesh remained flush without any folds or buckling while doing so, and the peritoneum was then closed with a running imbricating 2-0 V-Loc suture. At completion there was no exposed mesh present. Hemostasis was confirmed.   We then into the right side where in an identical fashion, the peritoneal flap was developed with a combination of sharp and cautery dissection spanning from the anterior superior iliac spine to the medial umbilical ligament.  Combination of blunt dissection and scissors with cautery were used to develop the preperitoneal space and the space of Retzius until Cooper's ligament and pubic symphysis were exposed medially, the small indirect sac was completely reduced, and the iliac vessels were exposed inferiorly.  The cord structures were peritonealized, and carefully protected throughout the dissection.  Once the right had been dissected he appeared to have both an indirect and a direct recurrence.  Hemostasis was confirmed.  A Bard 3D max mid weight right-sided mesh was then inserted and placed with excellent overlap of the direct, indirect and femoral spaces.  This was sutured to Cooper's ligament as well as superiorly on either side of the inferior epigastric vessels  with  simple interrupted 3-0 Vicryl's.  The peritoneal flap was then brought back up to completely cover the mesh, again ensuring that the mesh remained flush without any folding or buckling while doing so.  The peritoneum was closed with a running imbricating 2 oh V-Loc suture.  There was a small rent in the peritoneum which is closed with a simple interrupted 3-0 Vicryl.  On completion there is no exposed mesh and field is hemostatic. The abdomen was once again surveyed and confirmed to be free of any other abnormality or injury.  The abdomen was desufflated and the trocars were removed.  The skin incisions were closed with subcuticular 4-0 Monocryl and Dermabond. The patient was then awakened, extubated and taken to PACU in stable condition.  All counts were correct at the completion of the case.

## 2022-04-09 NOTE — Anesthesia Preprocedure Evaluation (Addendum)
Anesthesia Evaluation  Patient identified by MRN, date of birth, ID band Patient awake    Reviewed: Allergy & Precautions, NPO status , Patient's Chart, lab work & pertinent test results  History of Anesthesia Complications (+) PONV and history of anesthetic complications  Airway Mallampati: III  TM Distance: >3 FB Neck ROM: Full    Dental  (+) Teeth Intact, Dental Advisory Given   Pulmonary sleep apnea and Continuous Positive Airway Pressure Ventilation    Pulmonary exam normal breath sounds clear to auscultation       Cardiovascular hypertension, Pt. on medications Normal cardiovascular exam Rhythm:Regular Rate:Normal     Neuro/Psych negative neurological ROS  negative psych ROS   GI/Hepatic Neg liver ROS,GERD  Medicated,,INGUINAL HERNIA   Endo/Other  Hypothyroidism  Obesity   Renal/GU negative Renal ROS     Musculoskeletal  (+) Arthritis ,    Abdominal   Peds  Hematology negative hematology ROS (+)   Anesthesia Other Findings Day of surgery medications reviewed with the patient.  Reproductive/Obstetrics                             Anesthesia Physical Anesthesia Plan  ASA: 2  Anesthesia Plan: General   Post-op Pain Management: Tylenol PO (pre-op)* and Toradol IV (intra-op)*   Induction: Intravenous  PONV Risk Score and Plan: 3 and Scopolamine patch - Pre-op, Midazolam, Propofol infusion, Dexamethasone and Ondansetron  Airway Management Planned: Oral ETT  Additional Equipment:   Intra-op Plan:   Post-operative Plan: Extubation in OR  Informed Consent: I have reviewed the patients History and Physical, chart, labs and discussed the procedure including the risks, benefits and alternatives for the proposed anesthesia with the patient or authorized representative who has indicated his/her understanding and acceptance.     Dental advisory given  Plan Discussed with:  CRNA  Anesthesia Plan Comments:         Anesthesia Quick Evaluation

## 2022-04-09 NOTE — Discharge Instructions (Addendum)
HERNIA REPAIR: POST OP INSTRUCTIONS   EAT Gradually transition to a high fiber diet with a fiber supplement over the next few weeks after discharge.  Start with a pureed / full liquid diet (see below)  WALK Walk an hour a day (cumulative- not all at once).  Control your pain to do that.    CONTROL PAIN Control pain so that you can walk, sleep, tolerate sneezing/coughing, and go up/down stairs.  HAVE A BOWEL MOVEMENT DAILY Keep your bowels regular to avoid problems.  OK to try a laxative to override constipation.  OK to use an antidiarrheal to slow down diarrhea.  Call if not better after 2 tries  CALL IF YOU HAVE PROBLEMS/CONCERNS Call if you are still struggling despite following these instructions. Call if you have concerns not answered by these instructions  ######################################################################    DIET: Follow a light bland diet & liquids the first 24 hours after arrival home, such as soup, liquids, starches, etc.  Be sure to drink plenty of fluids.  Quickly advance to a usual solid diet within a few days.  Avoid fast food or heavy meals initially as you are more likely to get nauseated or have irregular bowels.  A low-sugar, high-fiber diet for the rest of your life is ideal.   Take your usually prescribed home medications unless otherwise directed.  PAIN CONTROL: Pain is best controlled by a usual combination of three different methods TOGETHER: Ice/Heat Over the counter pain medication Prescription pain medication Most patients will experience some swelling and bruising around the hernia(s) such as the bellybutton, groins, or old incisions.  Ice packs or heating pads (30-60 minutes up to 6 times a day) will help. Use ice for the first few days to help decrease swelling and bruising, then switch to heat to help relax tight/sore spots and speed recovery.  Some people prefer to use ice alone, heat alone, alternating between ice & heat.  Experiment  to what works for you.  Swelling and bruising can take several weeks to resolve.   It is helpful to take an over-the-counter pain medication regularly for the first days: Naproxen (Aleve, etc)  Two 220mg tabs twice a day OR Ibuprofen (Advil, etc) Three 200mg tabs four times a day (every meal & bedtime) AND Acetaminophen (Tylenol, etc) 325-650mg four times a day (every meal & bedtime) A  prescription for pain medication should be given to you upon discharge.  Take your pain medication as prescribed, IF NEEDED.  If you are having problems/concerns with the prescription medicine (does not control pain, nausea, vomiting, rash, itching, etc), please call us [phone] to see if we need to switch you to a different pain medicine that will work better for you and/or control your side effect better. If you need a refill on your pain medication, please contact your pharmacy.  They will contact our office to request authorization. Prescriptions will not be filled after 5 pm or on week-ends.  Avoid getting constipated.  Between the surgery and the pain medications, it is common to experience some constipation.  Increasing fluid intake and taking a fiber supplement (such as Metamucil, Citrucel, FiberCon, MiraLax, etc) 1-2 times a day regularly will usually help prevent this problem from occurring.  A mild laxative (prune juice, Milk of Magnesia, MiraLax, etc) should be taken according to package directions if there are no bowel movements after 48 hours.    Wash / shower every day, starting 2 days after surgery.  You may shower over   the skin glue which is waterproof.  No rubbing, scrubbing, lotions or ointments to incision(s). Do not soak or submerge.   Glue will flake off after about 2 weeks.  You may leave the incision open to air.  You may replace a dressing/Band-Aid to cover an incision for comfort if you wish.  Continue to shower over incision(s) after the dressing is off.  ACTIVITIES as tolerated:   You  may resume regular (light) daily activities beginning the next day--such as daily self-care, walking, climbing stairs--gradually increasing activities as tolerated.  Control your pain so that you can walk an hour a day.  If you can walk 30 minutes without difficulty, it is safe to try more intense activity such as jogging, treadmill, bicycling, low-impact aerobics, swimming, etc. Refrain from the most intensive and strenuous activity such as sit-ups, heavy lifting, contact sports, etc  Refrain from any heavy lifting or straining until 6 weeks after surgery.   DO NOT PUSH THROUGH PAIN.  Let pain be your guide: If it hurts to do something, don't do it.  Pain is your body warning you to avoid that activity for another week until the pain goes down. You may drive when you are no longer taking prescription pain medication, you can comfortably wear a seatbelt, and you can safely maneuver your car and apply brakes. You may have sexual intercourse when it is comfortable.   FOLLOW UP in our office Please call [facility] at [phone] to set up an appointment to see your surgeon in the office for a follow-up appointment approximately 2-3 weeks after your surgery. Make sure that you call for this appointment the day you arrive home to insure a convenient appointment time.  9.  If you have disability of FMLA / Family leave forms, please bring the forms to the office for processing.  (do not give to your surgeon).  WHEN TO CALL us [phone]: Poor pain control Reactions / problems with new medications (rash/itching, nausea, etc)  Fever over 101.5 F (38.5 C) Inability to urinate Nausea and/or vomiting Worsening swelling or bruising Continued bleeding from incision. Increased pain, redness, or drainage from the incision   The clinic staff is available to answer your questions during regular business hours (8:30am-5pm).  Please don't hesitate to call and ask to speak to one of our nurses for clinical  concerns.   If you have a medical emergency, go to the nearest emergency room or call 911.  A surgeon from [facility] is always on call at the hospitals in [facility], [place], [place], [place], [place]  [id number] ?  [address] [address], [place], [place]   [address] MAIN: [phone] ? TOLL FREE: [phone] ? FAX: [phone] [email or web address]

## 2022-05-20 ENCOUNTER — Other Ambulatory Visit: Payer: Self-pay | Admitting: Urology

## 2022-06-24 ENCOUNTER — Other Ambulatory Visit: Payer: Self-pay | Admitting: Urology

## 2022-07-18 ENCOUNTER — Other Ambulatory Visit: Payer: 59

## 2022-07-19 LAB — PSA, TOTAL AND FREE
PSA, Free Pct: 24.2 %
PSA, Free: 1.45 ng/mL
Prostate Specific Ag, Serum: 6 ng/mL — ABNORMAL HIGH (ref 0.0–4.0)

## 2022-07-25 ENCOUNTER — Ambulatory Visit: Payer: 59 | Admitting: Urology

## 2022-07-25 ENCOUNTER — Encounter: Payer: Self-pay | Admitting: Urology

## 2022-07-25 VITALS — BP 143/84 | HR 56 | Ht 68.0 in | Wt 207.0 lb

## 2022-07-25 DIAGNOSIS — R972 Elevated prostate specific antigen [PSA]: Secondary | ICD-10-CM | POA: Diagnosis not present

## 2022-07-25 DIAGNOSIS — N138 Other obstructive and reflux uropathy: Secondary | ICD-10-CM

## 2022-07-25 DIAGNOSIS — N5201 Erectile dysfunction due to arterial insufficiency: Secondary | ICD-10-CM

## 2022-07-25 DIAGNOSIS — R351 Nocturia: Secondary | ICD-10-CM | POA: Diagnosis not present

## 2022-07-25 DIAGNOSIS — N401 Enlarged prostate with lower urinary tract symptoms: Secondary | ICD-10-CM | POA: Diagnosis not present

## 2022-07-25 DIAGNOSIS — R350 Frequency of micturition: Secondary | ICD-10-CM | POA: Diagnosis not present

## 2022-07-25 DIAGNOSIS — E291 Testicular hypofunction: Secondary | ICD-10-CM

## 2022-07-25 LAB — URINALYSIS, ROUTINE W REFLEX MICROSCOPIC
Bilirubin, UA: NEGATIVE
Glucose, UA: NEGATIVE
Ketones, UA: NEGATIVE
Leukocytes,UA: NEGATIVE
Nitrite, UA: NEGATIVE
Protein,UA: NEGATIVE
RBC, UA: NEGATIVE
Specific Gravity, UA: 1.02 (ref 1.005–1.030)
Urobilinogen, Ur: 0.2 mg/dL (ref 0.2–1.0)
pH, UA: 7 (ref 5.0–7.5)

## 2022-07-25 NOTE — Progress Notes (Signed)
Patient ID: Brandon Giles, male   DOB: 1960-06-28, 62 y.o.   MRN: [id number]  Subjective:  1. Elevated prostate specific antigen (PSA)   2. BPH with urinary obstruction   3. Nocturia   4. Urinary frequency   5. Erectile dysfunction due to arterial insufficiency   6. Primary hypogonadism in male      07/25/22: Brandon Giles returns today in f/u for his history below.  His PSA is down to 6.0 with a 24% f/t ration from 7.2 in 9/23.  He has BPH with BOO and nocturia and remains on alfuzosin and oxybutynin.  His IPSS is 12 which is stable.  He uses tadalafil 5mg  daily and sildenafil 60mg  prn.  He is having some back pain and would like to try to go off of the tadalafil for a while.    12/2821: Brandon Giles returns today in f/u for his history of BPH with BOO and LUTS and an elevated PSA with a negative biopsy in 2020.  He had an MRIP in 2/22 that had no high risk changes and a volume of 32ml.   His PSA prior to this visit is 7.2 with a 20% f/t ratio.  The level is stable since 11/21.  He is on alfuzosin and oxybutynin at bedtime.  He continues to use tadalafil 5mg  daily which has been helping his nocturia.  He has sildenafil for his ED but he rarely takes.  He will use 3 tabs with the tadalafil.  He has a history of hypogonadism and his T was 296 on 9/21 with a free T of 53.  He is no longer on clomid or TRT.  His IPSS is 14 which is markedly improved.  He is dealing recently with chronic diarrhea and is being evaluated by GI.   Brandon Giles did a flow rate today with a PF of 15ml/sec and a MF of 35ml/sec with 340ml voided.  HIs PVR is 163ml.  He continues to have some issues with nocturia and is better with evening fluid restriction but doesn't feel he empties well.    A few days ago, he had some sharp pain in his testes that lasted for about 2 hours.  He noted a little black dot on the scrotum.     IPSS     Row Name 07/25/22 1000         International Prostate Symptom Score   How often have you had the sensation  of not emptying your bladder? Less than half the time     How often have you had to urinate less than every two hours? About half the time     How often have you found you stopped and started again several times when you urinated? Less than 1 in 5 times     How often have you found it difficult to postpone urination? Less than 1 in 5 times     How often have you had a weak urinary stream? Less than 1 in 5 times     How often have you had to strain to start urination? Less than 1 in 5 times     How many times did you typically get up at night to urinate? 3 Times     Total IPSS Score 12       Quality of Life due to urinary symptoms   If you were to spend the rest of your life with your urinary condition just the way it is now how would you feel about that? Mixed  ROS:  ROS:  A complete review of systems was performed.  All systems are negative except for pertinent findings as noted.   Review of Systems  HENT:  Positive for sinus pain.   Musculoskeletal:  Positive for back pain.  All other systems reviewed and are negative.   Allergies  Allergen Reactions   Hydrocodone-Acetaminophen Other (See Comments)    Doesn't like how it makes him feel-dizzy,tachycardia    Outpatient Encounter Medications as of 07/25/2022  Medication Sig   alfuzosin (UROXATRAL) 10 MG 24 hr tablet Take 10 mg by mouth daily with breakfast.   amLODipine (NORVASC) 5 MG tablet Take 5 mg by mouth daily.   cetirizine (ZYRTEC) 10 MG tablet Take 10 mg by mouth daily.   fluticasone (FLONASE) 50 MCG/ACT nasal spray Place 1 spray into both nostrils daily.   HYDROcodone bit-homatropine (HYCODAN) 5-1.5 MG/5ML syrup Take 5 mLs by mouth 4 (four) times daily as needed for cough.   KRILL OIL PO Take 1 capsule by mouth daily.   levothyroxine (SYNTHROID) 50 MCG tablet Take 50 mcg by mouth daily before breakfast.   NON FORMULARY Pt uses a cpap nightly   omeprazole (PRILOSEC) 40 MG capsule Take 40 mg by mouth  daily.   oxybutynin (DITROPAN) 5 MG tablet TAKE 1 TABLET BY MOUTH AT  BEDTIME   pravastatin (PRAVACHOL) 40 MG tablet Take 40 mg by mouth daily.   sildenafil (REVATIO) 20 MG tablet TAKE 3 TO 5 TABLETS BY MOUTH 1 HOUR PRIOR TO INTERCOURSE.   tadalafil (CIALIS) 5 MG tablet TAKE (1) TABLET BY MOUTH ONCE DAILY AS NEEDED FOR ERECTILE DYSFUNCTION.   [DISCONTINUED] albuterol (VENTOLIN HFA) 108 (90 Base) MCG/ACT inhaler Inhale 2 puffs into the lungs every 6 (six) hours as needed for shortness of breath or wheezing. (Patient not taking: Reported on 07/25/2022)   [DISCONTINUED] carboxymethylcellulose (REFRESH PLUS) 0.5 % SOLN Place 1 drop into both eyes 3 (three) times daily as needed (dry eyes). (Patient not taking: Reported on 07/25/2022)   No facility-administered encounter medications on file as of 07/25/2022.    Past Medical History:  Diagnosis Date   Allergy    seasonal   Arthritis    Fingers   Bronchitis 02/2022   GERD (gastroesophageal reflux disease)    HOH (hard of hearing)    left ear   Hypertension    PONV (postoperative nausea and vomiting)    after hernia repair   Pre-diabetes    Shortness of breath    no longer a problem   Sleep apnea    CPAP    Past Surgical History:  Procedure Laterality Date   COLONOSCOPY  07/15/2013   Dr.Brodie   GANGLION CYST EXCISION  12/25/2011   Procedure: REMOVAL GANGLION OF WRIST;  Surgeon: Magnus Sinning, MD;  Location: [place];  Service: Orthopedics;  Laterality: Left;  excision of ganglion cyst of left wrist    HERNIA REPAIR  mid '90's   bilateral ing   KNEE ARTHROSCOPY  '99   left    NASAL SEPTOPLASTY W/ TURBINOPLASTY Bilateral 06/04/2013   Procedure: BILATERNAL NASAL SEPTOPLASTY WITH TURBINATE REDUCTION;  Surgeon: Izora Gala, MD;  Location: [place];  Service: ENT;  Laterality: Bilateral;   SEPTOPLASTY     TONSILLECTOMY      Social History   Socioeconomic History   Marital status: Married     Spouse name: Not on file   Number of children: Not on file   Years of education: Not  on file   Highest education level: Not on file  Occupational History   Occupation: Retired  Tobacco Use   Smoking status: Never   Smokeless tobacco: Never  Vaping Use   Vaping Use: Never used  Substance and Sexual Activity   Alcohol use: No   Drug use: Not Currently   Sexual activity: Not on file  Other Topics Concern   Not on file  Social History Narrative   Not on file   Social Determinants of Health   Financial Resource Strain: Not on file  Food Insecurity: Not on file  Transportation Needs: Not on file  Physical Activity: Not on file  Stress: Not on file  Social Connections: Not on file  Intimate Partner Violence: Not on file    Family History  Problem Relation Age of Onset   Heart disease Mother    Lung cancer Mother    Stroke Mother    Diabetes Father    Colon cancer Neg Hx    Esophageal cancer Neg Hx    Rectal cancer Neg Hx    Stomach cancer Neg Hx    Colon polyps Neg Hx        Objective: Vitals:   07/25/22 1012  BP: (!) 143/84  Pulse: (!) 56     Physical Exam Vitals reviewed.  Constitutional:      Appearance: Normal appearance.  Neurological:     Mental Status: He is alert.     Lab Results:  PSA No results found for: "PSA" Testosterone  Date Value Ref Range Status  01/17/2022 296 264 - 916 ng/dL Final    Comment:    Adult male reference interval is based on a population of healthy nonobese males (BMI <30) between 59 and 35 years old. [place], [place] [phone]. PMID: [id number]. **Verified by repeat analysis**   04/05/2021 340 264 - 916 ng/dL Final    Comment:    Adult male reference interval is based on a population of healthy nonobese males (BMI <30) between 56 and 69 years old. [place], [place] [phone]. PMID: [id number].   03/20/2020 383 264 - 916 ng/dL Final    Comment:    Adult male reference interval is based  on a population of healthy nonobese males (BMI <30) between 17 and 56 years old. [place], [place] [phone]. PMID: [id number].    Lab Results  Component Value Date   PSA1 6.0 (H) 07/18/2022    Lab Results  Component Value Date   HCT 45.6 06/26/2021   HCT 46.5 03/20/2020   Lab Results  Component Value Date   HGB 15.6 06/26/2021   HGB 15.7 03/20/2020    UA is unremarkable today.   Studies/Results:     Assessment & Plan: Elevated PSA.   His PSA is down.  I am going to have him return with a PSA in 6 months.    BPH with BOO with OAB.   Continue alfuzosin and oxybutynin.   He will hold the tadalafil to see if that will impact his back pain. His nocturia is improved with the CPAP.AE and TURP.       ED. Contine  Sildenafil and can go up to 100mg .  If that doesn't help we could consider adding vardenafil.   Hypogonadism.  Free testosterone was normal at last check. Repeat in 6 months.      No orders of the defined types were placed in this encounter.     Orders Placed This Encounter  Procedures   Urinalysis,  Routine w reflex microscopic   PSA, total and free    Standing Status:   Future    Standing Expiration Date:   07/25/2023   Testosterone Free, Profile I    Standing Status:   Future    Standing Expiration Date:   07/25/2023      Return in about 6 months (around 01/25/2023) for with PSA and testosterone panel..   CC: Redmond School, MD      Irine Seal 07/26/2022 Patient ID: Brandon Giles, male   DOB: Nov 25, 1960, 62 y.o.   MRN: [id number]

## 2022-08-23 ENCOUNTER — Telehealth: Payer: Self-pay

## 2022-08-23 NOTE — Telephone Encounter (Signed)
Attempted to return call from patient 08/22/2022, he left a vm advising our office to contact him. I left a voicemail advising patient to contact office.

## 2022-08-26 ENCOUNTER — Telehealth: Payer: Self-pay

## 2022-08-26 NOTE — Telephone Encounter (Signed)
Tried calling patient back from on based with concerns of medication Cialis for his prostate with no answer. Left voiced message for return call

## 2022-08-27 ENCOUNTER — Telehealth: Payer: Self-pay

## 2022-08-27 NOTE — Telephone Encounter (Signed)
Patient called advising that at his last appointment you had discussed with him if tadalafil (CIALIS) 5 MG tablet didn't work he could try another medication. He wanted to know if that medication could be sent to the pharmacy listed below.  Pharmacy:  [facility] - [place], [place] - [id number]     Thank you

## 2022-09-02 ENCOUNTER — Other Ambulatory Visit: Payer: Self-pay

## 2022-09-02 NOTE — Telephone Encounter (Signed)
Patient needing refills on sildenafil (REVATIO) 20 MG tablet filled as soon as possible.  Patient stated the tadalafil (CIALIS) 5 MG tablet  makes his back hurt.  Pharmacy:  [facility] - [place], [place] - [id number] Phone: [phone]  Fax: [phone]      Thank you.

## 2022-09-03 MED ORDER — SILDENAFIL CITRATE 20 MG PO TABS: ORAL_TABLET | ORAL | 0 refills | Status: DC

## 2022-09-05 ENCOUNTER — Ambulatory Visit: Payer: 59 | Admitting: Urology

## 2022-09-05 VITALS — BP 143/84 | HR 80 | Ht 68.0 in | Wt 207.0 lb

## 2022-09-05 DIAGNOSIS — N138 Other obstructive and reflux uropathy: Secondary | ICD-10-CM

## 2022-09-05 DIAGNOSIS — R351 Nocturia: Secondary | ICD-10-CM | POA: Diagnosis not present

## 2022-09-05 DIAGNOSIS — N5201 Erectile dysfunction due to arterial insufficiency: Secondary | ICD-10-CM | POA: Diagnosis not present

## 2022-09-05 DIAGNOSIS — N401 Enlarged prostate with lower urinary tract symptoms: Secondary | ICD-10-CM | POA: Diagnosis not present

## 2022-09-05 DIAGNOSIS — R350 Frequency of micturition: Secondary | ICD-10-CM | POA: Diagnosis not present

## 2022-09-05 LAB — URINALYSIS, ROUTINE W REFLEX MICROSCOPIC
Bilirubin, UA: NEGATIVE
Glucose, UA: NEGATIVE
Ketones, UA: NEGATIVE
Leukocytes,UA: NEGATIVE
Nitrite, UA: NEGATIVE
Protein,UA: NEGATIVE
RBC, UA: NEGATIVE
Specific Gravity, UA: 1.015 (ref 1.005–1.030)
Urobilinogen, Ur: 0.2 mg/dL (ref 0.2–1.0)
pH, UA: 7 (ref 5.0–7.5)

## 2022-09-05 LAB — BLADDER SCAN AMB NON-IMAGING: Scan Result: 13

## 2022-09-05 MED ORDER — MIRABEGRON ER 25 MG PO TB24: 25.0000 mg | ORAL_TABLET | Freq: Every day | ORAL | 0 refills | Status: DC

## 2022-09-05 NOTE — Progress Notes (Signed)
Patient ID: Brandon Giles, male   DOB: Mar 19, 1961, 62 y.o.   MRN: [id number]  Subjective:  1. Nocturia   2. BPH with urinary obstruction   3. Urinary frequency   4. Erectile dysfunction due to arterial insufficiency      09/05/22: Brandon Giles returns today in f/u.  He had back pain that appears to have been secondary to the tadalafil.  He is doing ok on sildenafil.  He remains on oxybutynin at bedtime and alfuzosin.  He is doing better with the CPAP and tadalafil but since he came off of the tadalafil his nocturia is up to q2hr.   He has high volume voids but he has intermittency with a sensation of incomplete emptying.   His PVR is 13ml.  His UA is clear.   3/28/24[name] returns today in f/u for his history below.  His PSA is down to 6.0 with a 24% f/t ration from 7.2 in 9/23.  He has BPH with BOO and nocturia and remains on alfuzosin and oxybutynin.  His IPSS is 12 which is stable.  He uses tadalafil 5mg  daily and sildenafil 60mg  prn.  He is having some back pain and would like to try to go off of the tadalafil for a while.    12/2821: Brandon Giles returns today in f/u for his history of BPH with BOO and LUTS and an elevated PSA with a negative biopsy in 2020.  He had an MRIP in 2/22 that had no high risk changes and a volume of 47ml.   His PSA prior to this visit is 7.2 with a 20% f/t ratio.  The level is stable since 11/21.  He is on alfuzosin and oxybutynin at bedtime.  He continues to use tadalafil 5mg  daily which has been helping his nocturia.  He has sildenafil for his ED but he rarely takes.  He will use 3 tabs with the tadalafil.  He has a history of hypogonadism and his T was 296 on 9/21 with a free T of 53.  He is no longer on clomid or TRT.  His IPSS is 14 which is markedly improved.  He is dealing recently with chronic diarrhea and is being evaluated by GI.   Brandon Giles did a flow rate today with a PF of 66ml/sec and a MF of 51ml/sec with voided.  HIs PVR is .  He continues to have some issues  with nocturia and is better with evening fluid restriction but doesn't feel he empties well.    A few days ago, he had some sharp pain in his testes that lasted for about 2 hours.  He noted a little black dot on the scrotum.     IPSS     Row Name 09/05/22 1000         International Prostate Symptom Score   How often have you had the sensation of not emptying your bladder? More than half the time     How often have you had to urinate less than every two hours? More than half the time     How often have you found you stopped and started again several times when you urinated? More than half the time     How often have you found it difficult to postpone urination? More than half the time     How often have you had a weak urinary stream? About half the time     How often have you had to strain to start urination? Less than  half the time     How many times did you typically get up at night to urinate? 5 Times     Total IPSS Score 26       Quality of Life due to urinary symptoms   If you were to spend the rest of your life with your urinary condition just the way it is now how would you feel about that? Mostly Disatisfied                ROS:  ROS:  A complete review of systems was performed.  All systems are negative except for pertinent findings as noted.   Review of Systems  HENT:  Positive for sinus pain.   Musculoskeletal:  Positive for back pain.  All other systems reviewed and are negative.   Allergies  Allergen Reactions   Hydrocodone-Acetaminophen Other (See Comments)    Doesn't like how it makes him feel-dizzy,tachycardia    Outpatient Encounter Medications as of 09/05/2022  Medication Sig   alfuzosin (UROXATRAL) 10 MG 24 hr tablet Take 10 mg by mouth daily with breakfast.   amLODipine (NORVASC) 5 MG tablet Take 5 mg by mouth daily.   cetirizine (ZYRTEC) 10 MG tablet Take 10 mg by mouth daily.   fluticasone (FLONASE) 50 MCG/ACT nasal spray Place 1 spray into both  nostrils daily.   levothyroxine (SYNTHROID) 50 MCG tablet Take 50 mcg by mouth daily before breakfast.   mirabegron ER (MYRBETRIQ) 25 MG TB24 tablet Take 1 tablet (25 mg total) by mouth daily.   NON FORMULARY Pt uses a cpap nightly   omeprazole (PRILOSEC) 40 MG capsule Take 40 mg by mouth daily.   pravastatin (PRAVACHOL) 40 MG tablet Take 40 mg by mouth daily.   sildenafil (REVATIO) 20 MG tablet TAKE 3 TO 5 TABLETS BY MOUTH 1 HOUR PRIOR TO INTERCOURSE.   [DISCONTINUED] oxybutynin (DITROPAN) 5 MG tablet TAKE 1 TABLET BY MOUTH AT  BEDTIME   [DISCONTINUED] HYDROcodone bit-homatropine (HYCODAN) 5-1.5 MG/5ML syrup Take 5 mLs by mouth 4 (four) times daily as needed for cough.   [DISCONTINUED] KRILL OIL PO Take 1 capsule by mouth daily.   [DISCONTINUED] tadalafil (CIALIS) 5 MG tablet TAKE (1) TABLET BY MOUTH ONCE DAILY AS NEEDED FOR ERECTILE DYSFUNCTION.   No facility-administered encounter medications on file as of 09/05/2022.    Past Medical History:  Diagnosis Date   Allergy    seasonal   Arthritis    Fingers   Bronchitis 02/2022   GERD (gastroesophageal reflux disease)    HOH (hard of hearing)    left ear   Hypertension    PONV (postoperative nausea and vomiting)    after hernia repair   Pre-diabetes    Shortness of breath    no longer a problem   Sleep apnea    CPAP    Past Surgical History:  Procedure Laterality Date   COLONOSCOPY  07/15/2013   Dr.Brodie   GANGLION CYST EXCISION  12/25/2011   Procedure: REMOVAL GANGLION OF WRIST;  Surgeon: Drucilla Schmidt, MD;  Location: [facility];  Service: Orthopedics;  Laterality: Left;  excision of ganglion cyst of left wrist    HERNIA REPAIR  mid '90's   bilateral ing   KNEE ARTHROSCOPY  '99   left    NASAL SEPTOPLASTY W/ TURBINOPLASTY Bilateral 06/04/2013   Procedure: BILATERNAL NASAL SEPTOPLASTY WITH TURBINATE REDUCTION;  Surgeon: Serena Colonel, MD;  Location: [facility];  Service: ENT;  Laterality:  Bilateral;  SEPTOPLASTY     TONSILLECTOMY      Social History   Socioeconomic History   Marital status: Married    Spouse name: Not on file   Number of children: Not on file   Years of education: Not on file   Highest education level: Not on file  Occupational History   Occupation: Retired  Tobacco Use   Smoking status: Never   Smokeless tobacco: Never  Vaping Use   Vaping Use: Never used  Substance and Sexual Activity   Alcohol use: No   Drug use: Not Currently   Sexual activity: Not on file  Other Topics Concern   Not on file  Social History Narrative   Not on file   Social Determinants of Health   Financial Resource Strain: Not on file  Food Insecurity: Not on file  Transportation Needs: Not on file  Physical Activity: Not on file  Stress: Not on file  Social Connections: Not on file  Intimate Partner Violence: Not on file    Family History  Problem Relation Age of Onset   Heart disease Mother    Lung cancer Mother    Stroke Mother    Diabetes Father    Colon cancer Neg Hx    Esophageal cancer Neg Hx    Rectal cancer Neg Hx    Stomach cancer Neg Hx    Colon polyps Neg Hx        Objective: Vitals:   09/05/22 1044  BP: (!) 143/84  Pulse: 80     Physical Exam Vitals reviewed.  Constitutional:      Appearance: Normal appearance.  Neurological:     Mental Status: He is alert.     Lab Results:  PSA No results found for: "PSA" Testosterone  Date Value Ref Range Status  01/17/2022 296 264 - 916 ng/dL Final    Comment:    Adult male reference interval is based on a population of healthy nonobese males (BMI <30) between 55 and 90 years old. [place], et.al. JCEM [phone]. PMID: [id number]. **Verified by repeat analysis**   04/05/2021 340 264 - 916 ng/dL Final    Comment:    Adult male reference interval is based on a population of healthy nonobese males (BMI <30) between 6 and 1 years old. Travison, et.al. JCEM  [phone]. PMID: [id number].   03/20/2020 383 264 - 916 ng/dL Final    Comment:    Adult male reference interval is based on a population of healthy nonobese males (BMI <30) between 63 and 35 years old. Travison, et.al. JCEM [phone]. PMID: [id number].    Lab Results  Component Value Date   PSA1 6.0 (H) 07/18/2022    Lab Results  Component Value Date   HCT 45.6 06/26/2021   HCT 46.5 03/20/2020   Lab Results  Component Value Date   HGB 15.6 06/26/2021   HGB 15.7 03/20/2020    UA is unremarkable today.   Studies/Results:  PVR is 13ml   Assessment & Plan: BPH with BOO with OAB and bothersome nocturia.   Continue alfuzosin change to Myrbetriq 25mg  and increase to 50mg  as needed.   Samples given.    I will get him set up for urodynamics and he will do a voiding diary.   F/u with results for possible cystoscopy.   ED. Contine  Sildenafil.   Hypogonadism.  Free testosterone was normal at last check. He is scheduled for a repeat PSA and testosterone.      Meds ordered this  encounter  Medications   mirabegron ER (MYRBETRIQ) 25 MG TB24 tablet    Sig: Take 1 tablet (25 mg total) by mouth daily.    Dispense:  28 tablet    Refill:  0      Orders Placed This Encounter  Procedures   Urinalysis, Routine w reflex microscopic   Ambulatory referral to Urology    Referral Priority:   Routine    Referral Type:   Consultation    Referral Reason:   Specialty Services Required    Referred to Provider:   Bjorn Pippin, MD    Requested Specialty:   Urology    Number of Visits Requested:   1   BLADDER SCAN AMB NON-IMAGING      Return for Next available with urodynamics results for possible cystoscopy. .   CC: Elfredia Nevins, MD      Bjorn Pippin 09/06/2022 Patient ID: Brandon Giles, male   DOB: November 10, 1960, 62 y.o.   MRN: [id number]

## 2022-09-05 NOTE — Progress Notes (Addendum)
post void residual=13 

## 2022-09-06 ENCOUNTER — Encounter: Payer: Self-pay | Admitting: Urology

## 2022-10-14 ENCOUNTER — Encounter: Payer: Self-pay | Admitting: Urology

## 2022-10-17 ENCOUNTER — Ambulatory Visit: Payer: 59 | Admitting: Urology

## 2022-10-17 ENCOUNTER — Encounter: Payer: Self-pay | Admitting: Urology

## 2022-10-17 VITALS — BP 153/83 | HR 109 | Ht 68.0 in | Wt 207.0 lb

## 2022-10-17 DIAGNOSIS — N138 Other obstructive and reflux uropathy: Secondary | ICD-10-CM

## 2022-10-17 DIAGNOSIS — R351 Nocturia: Secondary | ICD-10-CM

## 2022-10-17 DIAGNOSIS — N5201 Erectile dysfunction due to arterial insufficiency: Secondary | ICD-10-CM

## 2022-10-17 DIAGNOSIS — N401 Enlarged prostate with lower urinary tract symptoms: Secondary | ICD-10-CM

## 2022-10-17 DIAGNOSIS — R972 Elevated prostate specific antigen [PSA]: Secondary | ICD-10-CM

## 2022-10-17 DIAGNOSIS — N3281 Overactive bladder: Secondary | ICD-10-CM

## 2022-10-17 DIAGNOSIS — R350 Frequency of micturition: Secondary | ICD-10-CM

## 2022-10-17 LAB — URINALYSIS, ROUTINE W REFLEX MICROSCOPIC
Bilirubin, UA: NEGATIVE
Glucose, UA: NEGATIVE
Leukocytes,UA: NEGATIVE
Nitrite, UA: NEGATIVE
RBC, UA: NEGATIVE
Specific Gravity, UA: 1.02 (ref 1.005–1.030)
Urobilinogen, Ur: 1 mg/dL (ref 0.2–1.0)
pH, UA: 6 (ref 5.0–7.5)

## 2022-10-17 MED ORDER — CIPROFLOXACIN HCL 500 MG PO TABS
500.0000 mg | ORAL_TABLET | Freq: Once | ORAL | Status: AC
  Administered 2022-10-17: 500 mg via ORAL

## 2022-10-17 NOTE — Progress Notes (Signed)
Patient ID: Brandon Giles, male   DOB: Aug 15, 1960, 62 y.o.   MRN: [id number]  Subjective:  1. BPH with urinary obstruction   2. Elevated prostate specific antigen (PSA)   3. Urinary frequency   4. Nocturia   5. Erectile dysfunction due to arterial insufficiency      10/17/22: Brandon Giles returns today in /fu.  He is on afluzosin but was out at his last visit.  He is on oxybutynin at bedtime but still has nocturia.  He does better with the CPAP.  He is doing well with the sildenafil.  He added Saw Palmetto at the advice of his pastor.  He tried Myrbetriq 25mg  but that didn't help.  His IPSS is 28 with nocturia x 5 and frequency.  He has less intermittency with the alfuzosin.  He is primarily drinking water.  He had urodynamics with a capacity of but stability.   His PF was low with elevated voiding pressure and a PVR of .    5/9/24[name] returns today in f/u.  He had back pain that appears to have been secondary to the tadalafil.  He is doing ok on sildenafil.  He remains on oxybutynin at bedtime and alfuzosin.  He is doing better with the CPAP and tadalafil but since he came off of the tadalafil his nocturia is up to q2hr.   He has high volume voids but he has intermittency with a sensation of incomplete emptying.   His PVR is 13ml.  His UA is clear.   3/28/24[name] returns today in f/u for his history below.  His PSA is down to 6.0 with a 24% f/t ration from 7.2 in 9/23.  He has BPH with BOO and nocturia and remains on alfuzosin and oxybutynin.  His IPSS is 12 which is stable.  He uses tadalafil 5mg  daily and sildenafil 60mg  prn.  He is having some back pain and would like to try to go off of the tadalafil for a while.    12/2821: Brandon Giles returns today in f/u for his history of BPH with BOO and LUTS and an elevated PSA with a negative biopsy in 2020.  He had an MRIP in 2/22 that had no high risk changes and a volume of 47ml.   His PSA prior to this visit is 7.2 with a 20% f/t ratio.  The level is  stable since 11/21.  He is on alfuzosin and oxybutynin at bedtime.  He continues to use tadalafil 5mg  daily which has been helping his nocturia.  He has sildenafil for his ED but he rarely takes.  He will use 3 tabs with the tadalafil.  He has a history of hypogonadism and his T was 296 on 9/21 with a free T of 53.  He is no longer on clomid or TRT.  His IPSS is 14 which is markedly improved.  He is dealing recently with chronic diarrhea and is being evaluated by GI.   Brandon Giles did a flow rate today with a PF of 31ml/sec and a MF of 74ml/sec with voided.  HIs PVR is .  He continues to have some issues with nocturia and is better with evening fluid restriction but doesn't feel he empties well.    A few days ago, he had some sharp pain in his testes that lasted for about 2 hours.  He noted a little black dot on the scrotum.     IPSS     Row Name 10/17/22 1300  International Prostate Symptom Score   How often have you had the sensation of not emptying your bladder? About half the time     How often have you had to urinate less than every two hours? Almost always     How often have you found you stopped and started again several times when you urinated? More than half the time     How often have you found it difficult to postpone urination? More than half the time     How often have you had a weak urinary stream? More than half the time     How often have you had to strain to start urination? About half the time     How many times did you typically get up at night to urinate? 5 Times     Total IPSS Score 28       Quality of Life due to urinary symptoms   If you were to spend the rest of your life with your urinary condition just the way it is now how would you feel about that? Unhappy                ROS:  ROS:  A complete review of systems was performed.  All systems are negative except for pertinent findings as noted.   Review of Systems  HENT:  Positive for sinus  pain.   Musculoskeletal:  Positive for back pain.  All other systems reviewed and are negative.   Allergies  Allergen Reactions   Hydrocodone-Acetaminophen Other (See Comments)    Doesn't like how it makes him feel-dizzy,tachycardia    Outpatient Encounter Medications as of 10/17/2022  Medication Sig   alfuzosin (UROXATRAL) 10 MG 24 hr tablet Take 10 mg by mouth daily with breakfast.   amLODipine (NORVASC) 5 MG tablet Take 5 mg by mouth daily.   cetirizine (ZYRTEC) 10 MG tablet Take 10 mg by mouth daily.   fluticasone (FLONASE) 50 MCG/ACT nasal spray Place 1 spray into both nostrils daily.   levothyroxine (SYNTHROID) 50 MCG tablet Take 50 mcg by mouth daily before breakfast.   loratadine (CLARITIN REDITABS) 10 MG dissolvable tablet Take 10 mg by mouth daily.   NON FORMULARY Pt uses a cpap nightly   omeprazole (PRILOSEC) 40 MG capsule Take 40 mg by mouth daily.   oxybutynin (DITROPAN) 5 MG tablet Take 5 mg by mouth daily.   pravastatin (PRAVACHOL) 40 MG tablet Take 40 mg by mouth daily.   sildenafil (REVATIO) 20 MG tablet TAKE 3 TO 5 TABLETS BY MOUTH 1 HOUR PRIOR TO INTERCOURSE.   [DISCONTINUED] mirabegron ER (MYRBETRIQ) 25 MG TB24 tablet Take 1 tablet (25 mg total) by mouth daily.   [EXPIRED] ciprofloxacin (CIPRO) tablet 500 mg    No facility-administered encounter medications on file as of 10/17/2022.    Past Medical History:  Diagnosis Date   Allergy    seasonal   Arthritis    Fingers   Bronchitis 02/2022   GERD (gastroesophageal reflux disease)    HOH (hard of hearing)    left ear   Hypertension    PONV (postoperative nausea and vomiting)    after hernia repair   Pre-diabetes    Shortness of breath    no longer a problem   Sleep apnea    CPAP    Past Surgical History:  Procedure Laterality Date   COLONOSCOPY  07/15/2013   Dr.Brodie   GANGLION CYST EXCISION  12/25/2011   Procedure: REMOVAL GANGLION OF WRIST;  Surgeon: Drucilla Schmidt, MD;  Location: [facility];  Service: Orthopedics;  Laterality: Left;  excision of ganglion cyst of left wrist    HERNIA REPAIR  mid '90's   bilateral ing   KNEE ARTHROSCOPY  '99   left    NASAL SEPTOPLASTY W/ TURBINOPLASTY Bilateral 06/04/2013   Procedure: BILATERNAL NASAL SEPTOPLASTY WITH TURBINATE REDUCTION;  Surgeon: Serena Colonel, MD;  Location: [facility];  Service: ENT;  Laterality: Bilateral;   SEPTOPLASTY     TONSILLECTOMY      Social History   Socioeconomic History   Marital status: Married    Spouse name: Not on file   Number of children: Not on file   Years of education: Not on file   Highest education level: Not on file  Occupational History   Occupation: Retired  Tobacco Use   Smoking status: Never   Smokeless tobacco: Never  Vaping Use   Vaping Use: Never used  Substance and Sexual Activity   Alcohol use: No   Drug use: Not Currently   Sexual activity: Not on file  Other Topics Concern   Not on file  Social History Narrative   Not on file   Social Determinants of Health   Financial Resource Strain: Not on file  Food Insecurity: Not on file  Transportation Needs: Not on file  Physical Activity: Not on file  Stress: Not on file  Social Connections: Not on file  Intimate Partner Violence: Not on file    Family History  Problem Relation Age of Onset   Heart disease Mother    Lung cancer Mother    Stroke Mother    Diabetes Father    Colon cancer Neg Hx    Esophageal cancer Neg Hx    Rectal cancer Neg Hx    Stomach cancer Neg Hx    Colon polyps Neg Hx        Objective: Vitals:   10/17/22 1331  BP: (!) 153/83  Pulse: (!) 109     Physical Exam Vitals reviewed.  Constitutional:      Appearance: Normal appearance.  Neurological:     Mental Status: He is alert.     Lab Results:  PSA No results found for: "PSA" Testosterone  Date Value Ref Range Status  01/17/2022 296 264 - 916 ng/dL Final    Comment:    Adult male  reference interval is based on a population of healthy nonobese males (BMI <30) between 30 and 72 years old. Travison, et.al. JCEM [phone]. PMID: 91478295. **Verified by repeat analysis**   04/05/2021 340 264 - 916 ng/dL Final    Comment:    Adult male reference interval is based on a population of healthy nonobese males (BMI <30) between 70 and 60 years old. Travison, et.al. JCEM [phone]. PMID: 95284132.   03/20/2020 383 264 - 916 ng/dL Final    Comment:    Adult male reference interval is based on a population of healthy nonobese males (BMI <30) between 72 and 11 years old. Travison, et.al. JCEM [phone]. PMID: 34742595.    Lab Results  Component Value Date   PSA1 6.0 (H) 07/18/2022    Lab Results  Component Value Date   HCT 45.6 06/26/2021   HCT 46.5 03/20/2020   Lab Results  Component Value Date   HGB 15.6 06/26/2021   HGB 15.7 03/20/2020    UA is unremarkable today.   Studies/Results:  Urodynamic report reviewed.    Procedure: Cystoscopy  He was prepped with betadine and 2% lidocaine jelly and given Cipro 500mg  po.  The scope was easily passed.  He had normal urethra.  The external sphincter is normal.  His prostate is about 3cm with bilobar hyperplasia with obstruction.  The bladder wall has mild trabeculation but no mucosal lesions. UO's are normal.   A flow rate was done post cysto and he voided with a PF of  81ml/sec  Assessment & Plan: BPH with BOO with OAB and bothersome nocturia.   He is benefiting from the alfuzosin but didn't from the myrbetriq and isn't getting much help from the oxybutynin.  The CPAP helps. His prostate volume is 47ml.  The UDS demonstrated a small capacity unstable bladder with outlet obstruction.    I discussed options including continuing the alfuzosin or consideration of a procedure such as Urolift, Rezum, TURP, Aquablation or prostatic artery embolizations.   He is not ready to go with a  procedure yet but will research the options.  Elevated PSA.   He will get a repeat in 6 months.    ED. Contine  Sildenafil.        Meds ordered this encounter  Medications   ciprofloxacin (CIPRO) tablet 500 mg      Orders Placed This Encounter  Procedures   Urinalysis, Routine w reflex microscopic   PSA, total and free    Standing Status:   Future    Standing Expiration Date:   10/17/2023   PR COMPLEX UROFLOMETRY   Cystoscopy      Return in about 6 months (around 04/18/2023) for with PVR and PSA. .   CC: Elfredia Nevins, MD      Bjorn Pippin 10/18/2022 Patient ID: Helene Shoe, male   DOB: 1960-06-27, 62 y.o.   MRN: [id number]

## 2022-10-17 NOTE — Progress Notes (Signed)
Uroflow  Peak Flow: 9ml Average Flow: 5ml Voided Volume: Voiding Time: 21sec Flow Time: 21sec Time to Peak Flow: 5sec

## 2022-11-06 ENCOUNTER — Ambulatory Visit: Payer: 59 | Admitting: Podiatry

## 2022-11-06 ENCOUNTER — Encounter: Payer: Self-pay | Admitting: Podiatry

## 2022-11-06 DIAGNOSIS — L600 Ingrowing nail: Secondary | ICD-10-CM | POA: Diagnosis not present

## 2022-11-06 DIAGNOSIS — M722 Plantar fascial fibromatosis: Secondary | ICD-10-CM

## 2022-11-06 MED ORDER — TRIAMCINOLONE ACETONIDE 10 MG/ML IJ SUSP
10.0000 mg | Freq: Once | INTRAMUSCULAR | Status: AC
  Administered 2022-11-06: 10 mg

## 2022-11-06 NOTE — Patient Instructions (Signed)

## 2022-11-06 NOTE — Progress Notes (Signed)
Subjective:   Patient ID: Brandon Giles, male   DOB: 62 y.o.   MRN: [id number]   HPI Patient presents with a painful ingrown toenail of the left big toe that is been chronic and also has pain in the left heel it has been very tender just recently.  Patient has had no other changes health history   ROS      Objective:  Physical Exam  Neurovascular status intact with patient found to have incurvated medial and lateral borders of the left big toe painful when pressed and is noted to have pain in the left plantar fascia at the insertion of the tendon into the calcaneus.  Right mildly ingrown to the same degree     Assessment:  Ingrown toenail deformity chronic medial lateral borders left big toe Planter fasciitis acute left     Plan:  H&P reviewed both conditions recommended correction of ingrown and injection for heel sterile prep after patient read consent form understanding risk and anesthetized the left big toe 60 mg like Marcaine mixture with sterile instrumentation remove the medial lateral borders exposed matrix applied phenol 3 applications 30 seconds followed by alcohol lavage sterile dressing applied sterile dressing instructed what to do with it and leave it on 24 hours take it off earlier if needed and I also went ahead did sterile prep injected the plantar fascia left 3 mg Kenalog 5 mg Xylocaine will be seen back if symptoms persist

## 2022-11-30 ENCOUNTER — Other Ambulatory Visit: Payer: Self-pay | Admitting: Urology

## 2022-12-03 ENCOUNTER — Other Ambulatory Visit: Payer: Self-pay | Admitting: Urology

## 2023-01-16 ENCOUNTER — Other Ambulatory Visit: Payer: 59

## 2023-01-23 ENCOUNTER — Ambulatory Visit: Payer: 59 | Admitting: Urology

## 2023-04-14 ENCOUNTER — Other Ambulatory Visit: Payer: 59

## 2023-04-14 DIAGNOSIS — N138 Other obstructive and reflux uropathy: Secondary | ICD-10-CM

## 2023-04-14 DIAGNOSIS — R972 Elevated prostate specific antigen [PSA]: Secondary | ICD-10-CM

## 2023-04-15 LAB — PSA, TOTAL AND FREE
PSA, Free Pct: 28.7 %
PSA, Free: 1.98 ng/mL
Prostate Specific Ag, Serum: 6.9 ng/mL — ABNORMAL HIGH (ref 0.0–4.0)

## 2023-04-17 ENCOUNTER — Ambulatory Visit: Payer: 59 | Admitting: Urology

## 2023-04-17 VITALS — BP 151/81 | HR 75

## 2023-04-17 DIAGNOSIS — N3281 Overactive bladder: Secondary | ICD-10-CM

## 2023-04-17 DIAGNOSIS — R350 Frequency of micturition: Secondary | ICD-10-CM | POA: Diagnosis not present

## 2023-04-17 DIAGNOSIS — N401 Enlarged prostate with lower urinary tract symptoms: Secondary | ICD-10-CM

## 2023-04-17 DIAGNOSIS — R972 Elevated prostate specific antigen [PSA]: Secondary | ICD-10-CM | POA: Diagnosis not present

## 2023-04-17 DIAGNOSIS — N138 Other obstructive and reflux uropathy: Secondary | ICD-10-CM

## 2023-04-17 DIAGNOSIS — N5201 Erectile dysfunction due to arterial insufficiency: Secondary | ICD-10-CM

## 2023-04-17 DIAGNOSIS — R351 Nocturia: Secondary | ICD-10-CM | POA: Diagnosis not present

## 2023-04-17 LAB — URINALYSIS, ROUTINE W REFLEX MICROSCOPIC
Bilirubin, UA: NEGATIVE
Glucose, UA: NEGATIVE
Ketones, UA: NEGATIVE
Leukocytes,UA: NEGATIVE
Nitrite, UA: NEGATIVE
Protein,UA: NEGATIVE
RBC, UA: NEGATIVE
Specific Gravity, UA: 1.015 (ref 1.005–1.030)
Urobilinogen, Ur: 0.2 mg/dL (ref 0.2–1.0)
pH, UA: 7 (ref 5.0–7.5)

## 2023-04-17 MED ORDER — TADALAFIL 5 MG PO TABS: ORAL_TABLET | ORAL | 11 refills | Status: DC

## 2023-04-17 MED ORDER — SILDENAFIL CITRATE 20 MG PO TABS: ORAL_TABLET | ORAL | 5 refills | Status: DC

## 2023-04-17 NOTE — Progress Notes (Signed)
Patient ID: Brandon Giles, male   DOB: 07-07-1960, 62 y.o.   MRN: [id number]  Subjective:  1. Elevated prostate specific antigen (PSA)   2. BPH with urinary obstruction   3. Nocturia   4. Urinary frequency   5. Erectile dysfunction due to arterial insufficiency      04/17/23: Brandon Giles returns today in f/u.  He remains on alfuzosin and oxybutynin IR 5mg  at bedtime.  He has sildenafil and will take up to 3 but isn't sure it is helping enough to make it better than not taking it.  He has reduced sensation with sex. He has persistent frequency and urgency.  His IPSS is 25 with nocturia x 4 as a rule.  He tried to avoid fluids after 7pm.  He has daytime frequency.  His PSA is 6.9 with a favorable f/T ratio of 28.7.  this is in his usual range.   His prostate was 52ml on [place] in 2020.  UA is clear.   6/20/24[name] returns today in /fu.  He is on afluzosin but was out at his last visit.  He is on oxybutynin at bedtime but still has nocturia.  He does better with the CPAP.  He is doing well with the sildenafil.  He added Saw Palmetto at the advice of his pastor.  He tried Myrbetriq 25mg  but that didn't help.  His IPSS is 28 with nocturia x 5 and frequency.  He has less intermittency with the alfuzosin.  He is primarily drinking water.  He had urodynamics with a capacity of but stability.   His PF was low with elevated voiding pressure and a PVR of .    5/9/24[name] returns today in f/u.  He had back pain that appears to have been secondary to the tadalafil.  He is doing ok on sildenafil.  He remains on oxybutynin at bedtime and alfuzosin.  He is doing better with the CPAP and tadalafil but since he came off of the tadalafil his nocturia is up to q2hr.   He has high volume voids but he has intermittency with a sensation of incomplete emptying.   His PVR is 13ml.  His UA is clear.   3/28/24[name] returns today in f/u for his history below.  His PSA is down to 6.0 with a 24% f/t ration from 7.2 in  9/23.  He has BPH with BOO and nocturia and remains on alfuzosin and oxybutynin.  His IPSS is 12 which is stable.  He uses tadalafil 5mg  daily and sildenafil 60mg  prn.  He is having some back pain and would like to try to go off of the tadalafil for a while.    12/2821: Brandon Giles returns today in f/u for his history of BPH with BOO and LUTS and an elevated PSA with a negative biopsy in 2020.  He had an MRIP in 2/22 that had no high risk changes and a volume of 47ml.   His PSA prior to this visit is 7.2 with a 20% f/t ratio.  The level is stable since 11/21.  He is on alfuzosin and oxybutynin at bedtime.  He continues to use tadalafil 5mg  daily which has been helping his nocturia.  He has sildenafil for his ED but he rarely takes.  He will use 3 tabs with the tadalafil.  He has a history of hypogonadism and his T was 296 on 9/21 with a free T of 53.  He is no longer on clomid or TRT.  His  IPSS is 14 which is markedly improved.  He is dealing recently with chronic diarrhea and is being evaluated by GI.   Brandon Giles did a flow rate today with a PF of 32ml/sec and a MF of 65ml/sec with voided.  HIs PVR is .  He continues to have some issues with nocturia and is better with evening fluid restriction but doesn't feel he empties well.    A few days ago, he had some sharp pain in his testes that lasted for about 2 hours.  He noted a little black dot on the scrotum.         ROS:  ROS:  A complete review of systems was performed.  All systems are negative except for pertinent findings as noted.   Review of Systems  All other systems reviewed and are negative.   Allergies  Allergen Reactions   Hydrocodone-Acetaminophen Other (See Comments)    Doesn't like how it makes him feel-dizzy,tachycardia    Outpatient Encounter Medications as of 04/17/2023  Medication Sig   alfuzosin (UROXATRAL) 10 MG 24 hr tablet TAKE 1 TABLET BY MOUTH DAILY  WITH BREAKFAST   amLODipine (NORVASC) 5 MG tablet Take 5 mg by  mouth daily.   cetirizine (ZYRTEC) 10 MG tablet Take 10 mg by mouth daily.   fluticasone (FLONASE) 50 MCG/ACT nasal spray Place 1 spray into both nostrils daily.   levothyroxine (SYNTHROID) 50 MCG tablet Take 50 mcg by mouth daily before breakfast.   loratadine (CLARITIN REDITABS) 10 MG dissolvable tablet Take 10 mg by mouth daily.   NON FORMULARY Pt uses a cpap nightly   omeprazole (PRILOSEC) 40 MG capsule Take 40 mg by mouth daily.   oxybutynin (DITROPAN) 5 MG tablet TAKE 1 TABLET BY MOUTH AT  BEDTIME   pravastatin (PRAVACHOL) 40 MG tablet Take 40 mg by mouth daily.   tadalafil (CIALIS) 5 MG tablet Take 2-3 tabs daily po prn with 40-60mg  of sildenafil.   [DISCONTINUED] sildenafil (REVATIO) 20 MG tablet TAKE 3 TO 5 TABLETS BY MOUTH 1 HOUR PRIOR TO INTERCOURSE.   sildenafil (REVATIO) 20 MG tablet TAKE 3 TO 5 TABLETS BY MOUTH 1 HOUR PRIOR TO INTERCOURSE.   No facility-administered encounter medications on file as of 04/17/2023.    Past Medical History:  Diagnosis Date   Allergy    seasonal   Arthritis    Fingers   Bronchitis 02/2022   GERD (gastroesophageal reflux disease)    HOH (hard of hearing)    left ear   Hypertension    PONV (postoperative nausea and vomiting)    after hernia repair   Pre-diabetes    Shortness of breath    no longer a problem   Sleep apnea    CPAP    Past Surgical History:  Procedure Laterality Date   COLONOSCOPY  07/15/2013   Dr.Brodie   GANGLION CYST EXCISION  12/25/2011   Procedure: REMOVAL GANGLION OF WRIST;  Surgeon: Drucilla Schmidt, MD;  Location: [facility];  Service: Orthopedics;  Laterality: Left;  excision of ganglion cyst of left wrist    HERNIA REPAIR  mid '90's   bilateral ing   KNEE ARTHROSCOPY  '99   left    NASAL SEPTOPLASTY W/ TURBINOPLASTY Bilateral 06/04/2013   Procedure: BILATERNAL NASAL SEPTOPLASTY WITH TURBINATE REDUCTION;  Surgeon: Serena Colonel, MD;  Location: [facility];  Service: ENT;   Laterality: Bilateral;   SEPTOPLASTY     TONSILLECTOMY      Social History  Socioeconomic History   Marital status: Married    Spouse name: Not on file   Number of children: Not on file   Years of education: Not on file   Highest education level: Not on file  Occupational History   Occupation: Retired  Tobacco Use   Smoking status: Never   Smokeless tobacco: Never  Vaping Use   Vaping status: Never Used  Substance and Sexual Activity   Alcohol use: No   Drug use: Not Currently   Sexual activity: Not on file  Other Topics Concern   Not on file  Social History Narrative   Not on file   Social Drivers of Health   Financial Resource Strain: Not on file  Food Insecurity: Not on file  Transportation Needs: Not on file  Physical Activity: Not on file  Stress: Not on file  Social Connections: Not on file  Intimate Partner Violence: Not on file    Family History  Problem Relation Age of Onset   Heart disease Mother    Lung cancer Mother    Stroke Mother    Diabetes Father    Colon cancer Neg Hx    Esophageal cancer Neg Hx    Rectal cancer Neg Hx    Stomach cancer Neg Hx    Colon polyps Neg Hx        Objective: Vitals:   04/17/23 1348  BP: (!) 151/81  Pulse: 75     Physical Exam Vitals reviewed.  Constitutional:      Appearance: Normal appearance.  Neurological:     Mental Status: He is alert.     Lab Results:  PSA No results found for: "PSA" Testosterone  Date Value Ref Range Status  01/17/2022 296 264 - 916 ng/dL Final    Comment:    Adult male reference interval is based on a population of healthy nonobese males (BMI <30) between 60 and 92 years old. Travison, et.al. JCEM [phone]. PMID: 91478295. **Verified by repeat analysis**   04/05/2021 340 264 - 916 ng/dL Final    Comment:    Adult male reference interval is based on a population of healthy nonobese males (BMI <30) between 67 and 10 years old. Travison, et.al. JCEM  [phone]. PMID: 95284132.   03/20/2020 383 264 - 916 ng/dL Final    Comment:    Adult male reference interval is based on a population of healthy nonobese males (BMI <30) between 53 and 69 years old. Travison, et.al. JCEM [phone]. PMID: 34742595.    Lab Results  Component Value Date   PSA1 6.9 (H) 04/14/2023    Lab Results  Component Value Date   HCT 45.6 06/26/2021   HCT 46.5 03/20/2020   Lab Results  Component Value Date   HGB 15.6 06/26/2021   HGB 15.7 03/20/2020    UA is unremarkable today.   Studies/Results:   Assessment & Plan: BPH with BOO with OAB and bothersome nocturia.   He is benefiting from the alfuzosin but didn't from the myrbetriq and isn't getting much help from the oxybutynin.  I discussed other options including Urolift, Rezum, TURP and PAE.  He will think about it.   Elevated PSA.   PSA is stable with favorable f/t ratio.  Continue to monitor   ED. Contine  Sildenafil with the addition of tadalafil. .        Meds ordered this encounter  Medications   sildenafil (REVATIO) 20 MG tablet    Sig: TAKE 3 TO 5 TABLETS BY  MOUTH 1 HOUR PRIOR TO INTERCOURSE.    Dispense:  50 tablet    Refill:  5   tadalafil (CIALIS) 5 MG tablet    Sig: Take 2-3 tabs daily po prn with 40-60mg  of sildenafil.    Dispense:  30 tablet    Refill:  11      Orders Placed This Encounter  Procedures   Urinalysis, Routine w reflex microscopic      No follow-ups on file.   CC: Elfredia Nevins, MD      Bjorn Pippin 04/18/2023 Patient ID: Helene Shoe, male   DOB: 1960-07-29, 62 y.o.   MRN: [id number]

## 2023-04-17 NOTE — Patient Instructions (Addendum)
Options for BPH  Urolift or Rezum, Prostatic artery embolization. (Least invasive)  TURP (gold standard)

## 2023-05-21 NOTE — Progress Notes (Signed)
[facility] [address] [place] [address] Phone: [phone] Subjective:   INadine Counts, am serving as a scribe for Dr. Antoine Primas.  I'm seeing this patient by the request  of:  Elfredia Nevins, MD  CC: Low back pain  ION:[id number]  Brandon Giles is a 63 y.o. male coming in with complaint of back pain. Patient states pain on R side and pretty constant. Went to chiropractor for a while. Kind of helped. Home therapies only really take the edge off.    Patient did have an MRI of the prostate done in 2022 that showed prostatitis but no signs of any carcinoma.  CT abdomen pelvis was done in 2023 that did show a left-sided diverticulitis and a little abnormality of the appendix.  Patient did have a left inguinal hernia also noted.  Past Medical History:  Diagnosis Date   Allergy    seasonal   Arthritis    Fingers   Bronchitis 02/2022   GERD (gastroesophageal reflux disease)    HOH (hard of hearing)    left ear   Hypertension    PONV (postoperative nausea and vomiting)    after hernia repair   Pre-diabetes    Shortness of breath    no longer a problem   Sleep apnea    CPAP   Past Surgical History:  Procedure Laterality Date   COLONOSCOPY  07/15/2013   Dr.Brodie   GANGLION CYST EXCISION  12/25/2011   Procedure: REMOVAL GANGLION OF WRIST;  Surgeon: Drucilla Schmidt, MD;  Location: [facility];  Service: Orthopedics;  Laterality: Left;  excision of ganglion cyst of left wrist    HERNIA REPAIR  mid '90's   bilateral ing   KNEE ARTHROSCOPY  '99   left    NASAL SEPTOPLASTY W/ TURBINOPLASTY Bilateral 06/04/2013   Procedure: BILATERNAL NASAL SEPTOPLASTY WITH TURBINATE REDUCTION;  Surgeon: Serena Colonel, MD;  Location: [facility];  Service: ENT;  Laterality: Bilateral;   SEPTOPLASTY     TONSILLECTOMY     Social History   Socioeconomic History   Marital status: Married    Spouse name: Not on file    Number of children: Not on file   Years of education: Not on file   Highest education level: Not on file  Occupational History   Occupation: Retired  Tobacco Use   Smoking status: Never   Smokeless tobacco: Never  Vaping Use   Vaping status: Never Used  Substance and Sexual Activity   Alcohol use: No   Drug use: Not Currently   Sexual activity: Not on file  Other Topics Concern   Not on file  Social History Narrative   Not on file   Social Drivers of Health   Financial Resource Strain: Not on file  Food Insecurity: Not on file  Transportation Needs: Not on file  Physical Activity: Not on file  Stress: Not on file  Social Connections: Not on file   Allergies  Allergen Reactions   Hydrocodone-Acetaminophen Other (See Comments)    Doesn't like how it makes him feel-dizzy,tachycardia   Family History  Problem Relation Age of Onset   Heart disease Mother    Lung cancer Mother    Stroke Mother    Diabetes Father    Colon cancer Neg Hx    Esophageal cancer Neg Hx    Rectal cancer Neg Hx    Stomach cancer Neg Hx    Colon polyps Neg  Hx     Current Outpatient Medications (Endocrine & Metabolic):    levothyroxine (SYNTHROID) 50 MCG tablet, Take 50 mcg by mouth daily before breakfast.  Current Outpatient Medications (Cardiovascular):    amLODipine (NORVASC) 5 MG tablet, Take 5 mg by mouth daily.   pravastatin (PRAVACHOL) 40 MG tablet, Take 40 mg by mouth daily.   sildenafil (REVATIO) 20 MG tablet, TAKE 3 TO 5 TABLETS BY MOUTH 1 HOUR PRIOR TO INTERCOURSE.   tadalafil (CIALIS) 5 MG tablet, Take 2-3 tabs daily po prn with 40-60mg  of sildenafil.  Current Outpatient Medications (Respiratory):    cetirizine (ZYRTEC) 10 MG tablet, Take 10 mg by mouth daily.   fluticasone (FLONASE) 50 MCG/ACT nasal spray, Place 1 spray into both nostrils daily.   loratadine (CLARITIN REDITABS) 10 MG dissolvable tablet, Take 10 mg by mouth daily.  Current Outpatient Medications (Analgesics):     meloxicam (MOBIC) 15 MG tablet, Take 1 tablet (15 mg total) by mouth daily.   Current Outpatient Medications (Other):    ciprofloxacin (CIPRO) 500 MG tablet, Take 1 tablet (500 mg total) by mouth 2 (two) times daily.   gabapentin (NEURONTIN) 100 MG capsule, Take 2 capsules (200 mg total) by mouth at bedtime.   alfuzosin (UROXATRAL) 10 MG 24 hr tablet, TAKE 1 TABLET BY MOUTH DAILY  WITH BREAKFAST   NON FORMULARY, Pt uses a cpap nightly   omeprazole (PRILOSEC) 40 MG capsule, Take 40 mg by mouth daily.   oxybutynin (DITROPAN) 5 MG tablet, TAKE 1 TABLET BY MOUTH AT  BEDTIME   Reviewed prior external information including notes and imaging from  primary care provider As well as notes that were available from care everywhere and other healthcare systems.  Past medical history, social, surgical and family history all reviewed in electronic medical record.  No pertanent information unless stated regarding to the chief complaint.   Review of Systems:  No headache, visual changes, nausea, vomiting, diarrhea, constipation, dizziness, abdominal pain, skin rash, fevers, chills, night sweats, weight loss, swollen lymph nodes, body aches, joint swelling, chest pain, shortness of breath, mood changes. POSITIVE muscle aches  Objective  Blood pressure 136/86, pulse 75, height 5\' 8"  (1.727 m), weight 210 lb (95.3 kg), SpO2 98%.   General: No apparent distress alert and oriented x3 mood and affect normal, dressed appropriately.  HEENT: Pupils equal, extraocular movements intact  Respiratory: Patient's speak in full sentences and does not appear short of breath  Cardiovascular: No lower extremity edema, non tender, no erythema  Tightness noted in the parascapular area positive Faber Negative straight leg test.  Worsening pain with extension of the back.  Osteopathic findings T9 extended rotated and side bent left L2 flexed rotated and side bent right L4 flexed rotated left Sacrum right on right     Impression and Recommendations:     The above documentation has been reviewed and is accurate and complete Judi Saa, DO

## 2023-05-23 ENCOUNTER — Encounter: Payer: Self-pay | Admitting: Family Medicine

## 2023-05-23 ENCOUNTER — Ambulatory Visit: Payer: 59 | Admitting: Family Medicine

## 2023-05-23 ENCOUNTER — Ambulatory Visit (INDEPENDENT_AMBULATORY_CARE_PROVIDER_SITE_OTHER): Payer: 59

## 2023-05-23 VITALS — BP 136/86 | HR 75 | Ht 68.0 in | Wt 210.0 lb

## 2023-05-23 DIAGNOSIS — M545 Low back pain, unspecified: Secondary | ICD-10-CM

## 2023-05-23 DIAGNOSIS — M9904 Segmental and somatic dysfunction of sacral region: Secondary | ICD-10-CM

## 2023-05-23 DIAGNOSIS — M9902 Segmental and somatic dysfunction of thoracic region: Secondary | ICD-10-CM

## 2023-05-23 DIAGNOSIS — G8929 Other chronic pain: Secondary | ICD-10-CM

## 2023-05-23 DIAGNOSIS — M9903 Segmental and somatic dysfunction of lumbar region: Secondary | ICD-10-CM

## 2023-05-23 MED ORDER — MELOXICAM 15 MG PO TABS: 15.0000 mg | ORAL_TABLET | Freq: Every day | ORAL | 0 refills | Status: DC

## 2023-05-23 MED ORDER — GABAPENTIN 100 MG PO CAPS: 200.0000 mg | ORAL_CAPSULE | Freq: Every day | ORAL | 0 refills | Status: DC

## 2023-05-23 MED ORDER — CIPROFLOXACIN HCL 500 MG PO TABS: 500.0000 mg | ORAL_TABLET | Freq: Two times a day (BID) | ORAL | 0 refills | Status: DC

## 2023-05-23 NOTE — Addendum Note (Signed)
Addended by: Judi Saa on: 05/23/2023 12:28 PM   Modules accepted: Level of Service

## 2023-05-23 NOTE — Assessment & Plan Note (Addendum)
Lumbar pain seems to be more either facet arthropathy or the possibility of spinal stenosis.  Will get a repeat x-rays to further evaluate.  Discussed which activities to do and which ones to avoid.  Discussed icing regimen and home exercises.  Increase activity slowly otherwise.  Follow-up again in 6 to 8 weeks we discussed with patient to remove having some of the urinary difficulties we could consider the possibility of treatment for prostatitis.  Cipro given.  Warned of potential side effects.  Discussed icing regimen.  Discussed different oral anti-inflammatory such as meloxicam.  Increase activity slowly.

## 2023-05-23 NOTE — Patient Instructions (Addendum)
Meloxicam 10 days then as needed Gabapentin 200mg  prescribed Cipro 500 for 3 weeks Xray today Do prescribed exercises at least 3x a week  See you again in 6 weeks

## 2023-05-27 ENCOUNTER — Encounter: Payer: Self-pay | Admitting: Family Medicine

## 2023-07-03 NOTE — Progress Notes (Signed)
 [facility] [address] [place] [address] Phone: [phone] Subjective:   Bruce Donath, am serving as a scribe for Dr. Antoine Primas.  I'm seeing this patient by the request  of:  Elfredia Nevins, MD  CC: Back and neck pain follow-up  [id number]  Brandon Giles is a 63 y.o. male coming in with complaint of back and neck pain. OMT 05/23/2023. Patient states that he has good and bad days.  Overall nothing that is slowing him down too much.  He does notice when he does a lot more yard work size more discomfort and pain noted.  Medications patient has been prescribed: Meloxicam, Cipro  Taking: Only taking the meloxicam         Reviewed prior external information including notes and imaging from previsou exam, outside providers and external EMR if available.   As well as notes that were available from care everywhere and other healthcare systems.  Past medical history, social, surgical and family history all reviewed in electronic medical record.  No pertanent information unless stated regarding to the chief complaint.   Past Medical History:  Diagnosis Date   Allergy    seasonal   Arthritis    Fingers   Bronchitis 02/2022   GERD (gastroesophageal reflux disease)    HOH (hard of hearing)    left ear   Hypertension    PONV (postoperative nausea and vomiting)    after hernia repair   Pre-diabetes    Shortness of breath    no longer a problem   Sleep apnea    CPAP    Allergies  Allergen Reactions   Hydrocodone-Acetaminophen Other (See Comments)    Doesn't like how it makes him feel-dizzy,tachycardia     Review of Systems:  No headache, visual changes, nausea, vomiting, diarrhea, constipation, dizziness, abdominal pain, skin rash, fevers, chills, night sweats, weight loss, swollen lymph nodes, body aches, joint swelling, chest pain, shortness of breath, mood changes. POSITIVE muscle aches  Objective  Blood pressure  (!) 142/82, pulse 70, height 5\' 8"  (1.727 m), weight 209 lb (94.8 kg), SpO2 96%.   General: No apparent distress alert and oriented x3 mood and affect normal, dressed appropriately.  HEENT: Pupils equal, extraocular movements intact  Respiratory: Patient's speak in full sentences and does not appear short of breath  Cardiovascular: No lower extremity edema, non tender, no erythema  Gait MSK:  Back does have some loss lordosis noted.  Some tenderness to palpation in the paraspinal musculature.  Still seems to be left greater than right.  Positive Faber on the left side.  Osteopathic findings  C6 flexed rotated and side bent left T3 extended rotated and side bent right inhaled rib T9 extended rotated and side bent left L2 flexed rotated and side bent right L4 flexed rotated and side bent left Sacrum left on left    Assessment and Plan:  Lumbar pain Discussed with patient at great length.  Patient has done relatively well.  He does feel that the Cipro did help some of the discomfort he was having.  We discussed we could continue to do with a little more but at the moment we will continue to monitor.  Discussed icing regimen and home exercises, which activities to do and which ones to avoid.  Increase activity slowly.  Discussed icing regimen.  Follow-up again in 6 to 8 weeks    Nonallopathic problems  Decision today to treat with OMT was based on Physical  Exam  After verbal consent patient was treated with HVLA, ME, FPR techniques in cervical, rib, thoracic, lumbar, and sacral  areas  Patient tolerated the procedure well with improvement in symptoms  Patient given exercises, stretches and lifestyle modifications  See medications in patient instructions if given  Patient will follow up in 8 weeks    The above documentation has been reviewed and is accurate and complete Judi Saa, DO          Note: This dictation was prepared with Dragon dictation along with smaller  phrase technology. Any transcriptional errors that result from this process are unintentional.

## 2023-07-04 ENCOUNTER — Ambulatory Visit: Payer: 59 | Admitting: Family Medicine

## 2023-07-04 VITALS — BP 142/82 | HR 70 | Ht 68.0 in | Wt 209.0 lb

## 2023-07-04 DIAGNOSIS — M9904 Segmental and somatic dysfunction of sacral region: Secondary | ICD-10-CM | POA: Diagnosis not present

## 2023-07-04 DIAGNOSIS — M9903 Segmental and somatic dysfunction of lumbar region: Secondary | ICD-10-CM | POA: Diagnosis not present

## 2023-07-04 DIAGNOSIS — M9908 Segmental and somatic dysfunction of rib cage: Secondary | ICD-10-CM

## 2023-07-04 DIAGNOSIS — M9902 Segmental and somatic dysfunction of thoracic region: Secondary | ICD-10-CM

## 2023-07-04 DIAGNOSIS — M545 Low back pain, unspecified: Secondary | ICD-10-CM | POA: Diagnosis not present

## 2023-07-04 DIAGNOSIS — M9901 Segmental and somatic dysfunction of cervical region: Secondary | ICD-10-CM | POA: Diagnosis not present

## 2023-07-04 NOTE — Assessment & Plan Note (Addendum)
 Discussed with patient at great length.  Patient has done relatively well.  He does feel that the Cipro did help some of the discomfort he was having.  We discussed we could continue to do with a little more but at the moment we will continue to monitor.  Discussed icing regimen and home exercises, which activities to do and which ones to avoid.  Increase activity slowly.  Discussed icing regimen.  Follow-up again in  8 weeks

## 2023-07-04 NOTE — Patient Instructions (Signed)
 Keep doing what we are doing 2 month follow up

## 2023-07-14 LAB — HEMOGLOBIN A1C: Hemoglobin A1C: 6.2

## 2023-09-03 NOTE — Progress Notes (Unsigned)
 [facility] [address] [place] [address] Phone: [phone] Subjective:   Brandon Giles, am serving as a scribe for Dr. Ronnell Giles.  I'm seeing this patient by the request  of:  Brandon Parkins, MD  CC: Back and neck pain follow-up  [id number]  Brandon Giles is a 63 y.o. male coming in with complaint of back and neck pain. OMT 07/04/2023. Patient states doing well. Having a lot more pain free days. A little sore today. No new symptoms.  Overall feels like he has made some improvements.  Medications patient has been prescribed: Meloxicam , Gabapentin   Taking:         Reviewed prior external information including notes and imaging from previsou exam, outside providers and external EMR if available.   As well as notes that were available from care everywhere and other healthcare systems.  Past medical history, social, surgical and family history all reviewed in electronic medical record.  No pertanent information unless stated regarding to the chief complaint.   Past Medical History:  Diagnosis Date   Allergy    seasonal   Arthritis    Fingers   Bronchitis 02/2022   GERD (gastroesophageal reflux disease)    HOH (hard of hearing)    left ear   Hypertension    PONV (postoperative nausea and vomiting)    after hernia repair   Pre-diabetes    Shortness of breath    no longer a problem   Sleep apnea    CPAP    Allergies  Allergen Reactions   Hydrocodone -Acetaminophen  Other (See Comments)    Doesn't like how it makes him feel-dizzy,tachycardia     Review of Systems:  No headache, visual changes, nausea, vomiting, diarrhea, constipation, dizziness, abdominal pain, skin rash, fevers, chills, night sweats, weight loss, swollen lymph nodes, body aches, joint swelling, chest pain, shortness of breath, mood changes. POSITIVE muscle aches  Objective  Blood pressure 128/82, pulse 80, height 5\' 8"  (1.727 m), weight 208 lb  (94.3 kg), SpO2 98%.   General: No apparent distress alert and oriented x3 mood and affect normal, dressed appropriately.  HEENT: Pupils equal, extraocular movements intact  Respiratory: Patient's speak in full sentences and does not appear short of breath  Cardiovascular: No lower extremity edema, non tender, no erythema  Gait relatively normal MSK:  Back still has some tightness noted especially with straight leg test but no true radicular symptoms.  Patient does have more pain on the right than the left.  Seems to be more in the sacroiliac joint.  Some tightness noted in the neck with sidebending.  Negative Spurling's.  5 out of 5 strength of all the extremities.  Osteopathic findings  C2 flexed rotated and side bent right C7 flexed rotated and side bent left T3 extended rotated and side bent right inhaled rib T8 extended rotated and side bent left L2 flexed rotated and side bent right L4 flexed rotated and side bent right Sacrum right on right    Assessment and Plan:  Lumbar pain Patient is making significant progress overall.  Discussed icing regimen and home exercises.  Continuing to work on core strength.  BMI is 30 and would like to potentially get back down.  Increase activity.  Follow-up again in 3 to 4 months    Nonallopathic problems  Decision today to treat with OMT was based on Physical Exam  After verbal consent patient was treated with HVLA, ME, FPR techniques in cervical, rib, thoracic,  lumbar, and sacral  areas  Patient tolerated the procedure well with improvement in symptoms  Patient given exercises, stretches and lifestyle modifications  See medications in patient instructions if given  Patient will follow up in 12 weeks     The above documentation has been reviewed and is accurate and complete Brandon Eno [name], DO         Note: This dictation was prepared with Dragon dictation along with smaller phrase technology. Any transcriptional errors that  result from this process are unintentional.

## 2023-09-04 ENCOUNTER — Encounter: Payer: Self-pay | Admitting: Family Medicine

## 2023-09-04 ENCOUNTER — Ambulatory Visit: Admitting: Family Medicine

## 2023-09-04 VITALS — BP 128/82 | HR 80 | Ht 68.0 in | Wt 208.0 lb

## 2023-09-04 DIAGNOSIS — M545 Low back pain, unspecified: Secondary | ICD-10-CM | POA: Diagnosis not present

## 2023-09-04 DIAGNOSIS — M9903 Segmental and somatic dysfunction of lumbar region: Secondary | ICD-10-CM | POA: Diagnosis not present

## 2023-09-04 DIAGNOSIS — M9904 Segmental and somatic dysfunction of sacral region: Secondary | ICD-10-CM

## 2023-09-04 DIAGNOSIS — M9901 Segmental and somatic dysfunction of cervical region: Secondary | ICD-10-CM | POA: Diagnosis not present

## 2023-09-04 DIAGNOSIS — M9902 Segmental and somatic dysfunction of thoracic region: Secondary | ICD-10-CM | POA: Diagnosis not present

## 2023-09-04 DIAGNOSIS — M9908 Segmental and somatic dysfunction of rib cage: Secondary | ICD-10-CM | POA: Diagnosis not present

## 2023-09-04 NOTE — Assessment & Plan Note (Signed)
 Patient is making significant progress overall.  Discussed icing regimen and home exercises.  Continuing to work on core strength.  BMI is 30 and would like to potentially get back down.  Increase activity.  Follow-up again in 3 to 4 months

## 2023-09-11 LAB — PSA: PSA: 7

## 2023-10-13 ENCOUNTER — Other Ambulatory Visit: Payer: Self-pay | Admitting: *Deleted

## 2023-10-13 DIAGNOSIS — L720 Epidermal cyst: Secondary | ICD-10-CM

## 2023-10-14 ENCOUNTER — Ambulatory Visit: Admitting: General Surgery

## 2023-10-14 ENCOUNTER — Encounter: Payer: Self-pay | Admitting: General Surgery

## 2023-10-14 VITALS — BP 167/92 | HR 51 | Temp 98.0°F | Resp 16 | Ht 68.0 in | Wt 207.0 lb

## 2023-10-14 DIAGNOSIS — D170 Benign lipomatous neoplasm of skin and subcutaneous tissue of head, face and neck: Secondary | ICD-10-CM | POA: Insufficient documentation

## 2023-10-14 NOTE — Patient Instructions (Signed)
 Take 1000 mg tylenol  before coming to the office.  Plan on having sutures that remain in place for about ~14 days.

## 2023-10-14 NOTE — Progress Notes (Signed)
 [facility] History and Physical  Reason for Referral: Posterior neck cyst?  Referring Physician: Kathyleen Parkins, MD   Chief Complaint   New Patient (Initial Visit)     Brandon Giles is a 63 y.o. male.  HPI:   Discussed the use of AI scribe software for clinical note transcription with the patient, who gave verbal consent to proceed.  History of Present Illness [name] is a 63 year old male who presents with a cyst on the back of his neck. He was referred by Dr. Lewayne Records for evaluation of the cyst.  He has had a cyst on the back of his neck for over a year. It has not been infected or inflamed, but he experiences occasional tenderness in the neck area. He is interested in having the cyst removed as it bothers him.  He has noticed more frequent headaches recently, although he typically does not experience headaches. He attributes these headaches to other factors.     Past Medical History:  Diagnosis Date   Allergy    seasonal   Arthritis    Fingers   Bronchitis 02/2022   GERD (gastroesophageal reflux disease)    HOH (hard of hearing)    left ear   Hypertension    PONV (postoperative nausea and vomiting)    after hernia repair   Pre-diabetes    Shortness of breath    no longer a problem   Sleep apnea    CPAP    Past Surgical History:  Procedure Laterality Date   COLONOSCOPY  07/15/2013   Dr.Brodie   GANGLION CYST EXCISION  12/25/2011   Procedure: REMOVAL GANGLION OF WRIST;  Surgeon: Verlinda Gloss, MD;  Location: [facility];  Service: Orthopedics;  Laterality: Left;  excision of ganglion cyst of left wrist    HERNIA REPAIR  mid '90's   bilateral ing   KNEE ARTHROSCOPY  '99   left    NASAL SEPTOPLASTY W/ TURBINOPLASTY Bilateral 06/04/2013   Procedure: BILATERNAL NASAL SEPTOPLASTY WITH TURBINATE REDUCTION;  Surgeon: Janita Mellow, MD;  Location: [facility];  Service: ENT;  Laterality:  Bilateral;   SEPTOPLASTY     TONSILLECTOMY      Family History  Problem Relation Age of Onset   Heart disease Mother    Lung cancer Mother    Stroke Mother    Diabetes Father    Colon cancer Neg Hx    Esophageal cancer Neg Hx    Rectal cancer Neg Hx    Stomach cancer Neg Hx    Colon polyps Neg Hx     Social History   Tobacco Use   Smoking status: Never   Smokeless tobacco: Never  Vaping Use   Vaping status: Never Used  Substance Use Topics   Alcohol use: No   Drug use: Not Currently    Medications: I have reviewed the patient's current medications. Allergies as of 10/14/2023       Reactions   Hydrocodone -acetaminophen  Other (See Comments)   Doesn't like how it makes him feel-dizzy,tachycardia        Medication List        Accurate as of October 14, 2023 11:59 PM. If you have any questions, ask your nurse or doctor.          STOP taking these medications    cetirizine 10 MG tablet Commonly known as: ZYRTEC Stopped by: Awilda Bogus   ciprofloxacin  500 MG tablet Commonly known as:  Cipro  Stopped by: Awilda Bogus   gabapentin  100 MG capsule Commonly known as: NEURONTIN  Stopped by: Awilda Bogus   meloxicam  15 MG tablet Commonly known as: MOBIC  Stopped by: Awilda Bogus   oxybutynin  5 MG tablet Commonly known as: DITROPAN  Stopped by: Awilda Bogus   tadalafil  5 MG tablet Commonly known as: CIALIS  Stopped by: Awilda Bogus       TAKE these medications    alfuzosin  10 MG 24 hr tablet Commonly known as: UROXATRAL  TAKE 1 TABLET BY MOUTH DAILY  WITH BREAKFAST   amLODipine 5 MG tablet Commonly known as: NORVASC Take 5 mg by mouth daily.   fluticasone 50 MCG/ACT nasal spray Commonly known as: FLONASE Place 1 spray into both nostrils daily.   levothyroxine 50 MCG tablet Commonly known as: SYNTHROID Take 50 mcg by mouth daily before breakfast.   loratadine 10 MG dissolvable tablet Commonly known as: CLARITIN  REDITABS Take 10 mg by mouth daily.   NON FORMULARY Pt uses a cpap nightly   omeprazole 40 MG capsule Commonly known as: PRILOSEC Take 40 mg by mouth daily.   pravastatin 40 MG tablet Commonly known as: PRAVACHOL Take 40 mg by mouth daily.   sildenafil  20 MG tablet Commonly known as: REVATIO  TAKE 3 TO 5 TABLETS BY MOUTH 1 HOUR PRIOR TO INTERCOURSE.         ROS:  A comprehensive review of systems was negative except for: Ears, nose, mouth, throat, and face: positive for sinus issues Gastrointestinal: positive for reflux symptoms Musculoskeletal: positive for back pain Neurological: positive for tremors  Blood pressure (!) 167/92, pulse (!) 51, temperature 98 F (36.7 C), temperature source Oral, resp. rate 16, height 5' 8 (1.727 m), weight 207 lb (93.9 kg), SpO2 96%. Physical Exam Physical Exam GENERAL: Alert, cooperative, well developed, no acute distress. HEENT: Normocephalic, normal oropharynx, moist mucous membranes. NECK: 3 cm mass on posterior neck, left of midline, feels more like a lipoma than a cyst, no central pit. CHEST: Clear to auscultation bilaterally, no wheezes, rhonchi, or crackles. CARDIOVASCULAR: Normal heart rate and rhythm ABDOMEN: Soft, non-tender, non-distended, without organomegaly, normal bowel sounds. EXTREMITIES: No cyanosis, edema, or leg swelling. NEUROLOGICAL: Cranial nerves grossly intact, moves all extremities without gross motor or sensory deficit.  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 Discussed Excision in the office with local. Explained risks: bleeding, infection, recurrence, seroma formation, finding something unexpected. - Schedule removal in office for September post-beach trip. - Discussed sutures will be in place for 10-14 days post-procedure. - Advise no swimming in pools, lakes, or ocean for 4 weeks post-procedure.    All questions were answered to  the satisfaction of the patient and family.  Future Appointments  Date Time Provider Department Center  11/27/2023  9:00 AM Isidro Margo, DO [facility] None  01/08/2024  3:00 PM Awilda Bogus, MD [facility] None  03/24/2024  8:30 AM [facility] None  04/01/2024  9:15 AM Homero Luster, MD [facility] None     Awilda Bogus 10/15/2023, 10:47 AM

## 2023-10-21 ENCOUNTER — Ambulatory Visit: Admitting: Family Medicine

## 2023-10-21 ENCOUNTER — Encounter: Payer: Self-pay | Admitting: Family Medicine

## 2023-10-21 VITALS — BP 136/79 | HR 61 | Temp 98.2°F | Ht 68.0 in | Wt 206.0 lb

## 2023-10-21 DIAGNOSIS — I10 Essential (primary) hypertension: Secondary | ICD-10-CM | POA: Diagnosis not present

## 2023-10-21 DIAGNOSIS — E785 Hyperlipidemia, unspecified: Secondary | ICD-10-CM | POA: Diagnosis not present

## 2023-10-21 DIAGNOSIS — K219 Gastro-esophageal reflux disease without esophagitis: Secondary | ICD-10-CM

## 2023-10-21 DIAGNOSIS — N400 Enlarged prostate without lower urinary tract symptoms: Secondary | ICD-10-CM | POA: Diagnosis not present

## 2023-10-21 DIAGNOSIS — R42 Dizziness and giddiness: Secondary | ICD-10-CM

## 2023-10-21 DIAGNOSIS — E039 Hypothyroidism, unspecified: Secondary | ICD-10-CM

## 2023-10-21 DIAGNOSIS — E119 Type 2 diabetes mellitus without complications: Secondary | ICD-10-CM

## 2023-10-21 DIAGNOSIS — G4733 Obstructive sleep apnea (adult) (pediatric): Secondary | ICD-10-CM

## 2023-10-21 DIAGNOSIS — N529 Male erectile dysfunction, unspecified: Secondary | ICD-10-CM

## 2023-10-21 NOTE — Patient Instructions (Signed)
 Referral placed for Vestibular rehab.  Have them fax over your labs.  Follow up in 6 months.

## 2023-10-22 DIAGNOSIS — G4733 Obstructive sleep apnea (adult) (pediatric): Secondary | ICD-10-CM | POA: Insufficient documentation

## 2023-10-22 DIAGNOSIS — N529 Male erectile dysfunction, unspecified: Secondary | ICD-10-CM | POA: Insufficient documentation

## 2023-10-22 DIAGNOSIS — R42 Dizziness and giddiness: Secondary | ICD-10-CM | POA: Insufficient documentation

## 2023-10-22 DIAGNOSIS — E119 Type 2 diabetes mellitus without complications: Secondary | ICD-10-CM | POA: Insufficient documentation

## 2023-10-22 NOTE — Assessment & Plan Note (Signed)
Stable. °-Continue amlodipine °

## 2023-10-22 NOTE — Assessment & Plan Note (Signed)
 Last A1c was in the prediabetic range.  Will continue to monitor closely.

## 2023-10-22 NOTE — Assessment & Plan Note (Addendum)
 Stable on CPAP

## 2023-10-22 NOTE — Assessment & Plan Note (Signed)
 Stable on levothyroxine.

## 2023-10-22 NOTE — Assessment & Plan Note (Signed)
 Unsure of control.  Need records from her most recent lipid panel.  Continue statin.

## 2023-10-22 NOTE — Assessment & Plan Note (Signed)
 Consistent with BPPV.  Referring to vestibular rehab.

## 2023-10-22 NOTE — Progress Notes (Signed)
 Subjective:  Patient ID: Brandon Giles, male    DOB: November 26, 1960  Age: 63 y.o. MRN: [id number]  CC:   Chief Complaint  Patient presents with   New Patient (Initial Visit)    Here to establish care     HPI:  63 year old male with hypertension, GERD, BPH and elevated PSA, hyperlipidemia, hypothyroidism, diabetes in remission, OSA, erectile dysfunction presents to establish care.  Patient states that overall he is doing well.  However, he does note that he has had several bouts of dizziness.  They have been severe at times.  He describes it as feeling as if the room is spinning.  Resolves with rest.  A1c now in the prediabetic range without the use of medication.  Blood pressure stable on amlodipine.  Unsure of the status of his lipids.  He is compliant with pravastatin.  Follows with urology.  Has significant nocturia.  Compliant with CPAP.  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) 10/22/2023   OSA (obstructive sleep apnea) 10/22/2023   Erectile dysfunction 10/22/2023   Dizziness 10/22/2023   Hyperlipidemia 10/21/2023   GERD (gastroesophageal reflux disease) 10/21/2023   Essential hypertension 10/21/2023   BPH (benign prostatic hyperplasia) 10/21/2023   Hypothyroidism 10/21/2023    Social Hx   Social History   Socioeconomic History   Marital status: Married    Spouse name: Not on file   Number of children: Not on file   Years of education: Not on file   Highest education level: Associate degree: occupational, Scientist, product/process development, or vocational program  Occupational History   Occupation: Retired  Tobacco Use   Smoking status: Never   Smokeless tobacco: Never  Vaping Use   Vaping status: Never Used  Substance and Sexual Activity   Alcohol use: No   Drug use: Not Currently   Sexual activity: Not on file  Other Topics Concern   Not on file  Social History Narrative   Not on file   Social Drivers of Health    Financial Resource Strain: Low Risk  (10/20/2023)   Overall Financial Resource Strain (CARDIA)    Difficulty of Paying Living Expenses: Not hard at all  Food Insecurity: No Food Insecurity (10/20/2023)   Hunger Vital Sign    Worried About Running Out of Food in the Last Year: Never true    Ran Out of Food in the Last Year: Never true  Transportation Needs: No Transportation Needs (10/20/2023)   PRAPARE - Administrator, Civil Service (Medical): No    Lack of Transportation (Non-Medical): No  Physical Activity: Insufficiently Active (10/20/2023)   Exercise Vital Sign    Days of Exercise per Week: 2 days    Minutes of Exercise per Session: 40 min  Stress: No Stress Concern Present (10/20/2023)   [employer] of Occupational Health - Occupational Stress Questionnaire    Feeling of Stress: Not at all  Social Connections: Moderately Integrated (10/20/2023)   Social Connection and Isolation Panel    Frequency of Communication with Friends and Family: Once a week    Frequency of Social Gatherings with Friends and Family: Once a week    Attends Religious Services: More than 4 times per year    Active Member of [employer] or Organizations: Yes    Attends Engineer, structural: More than 4 times per year    Marital Status: Married    Review of Systems Per HPI  Objective:  BP 136/79  Pulse 61   Temp 98.2 F (36.8 C)   Ht 5' 8 (1.727 m)   Wt 206 lb (93.4 kg)   SpO2 98%   BMI 31.32 kg/m      10/21/2023    2:12 PM 10/14/2023    9:34 AM 09/04/2023    9:00 AM  BP/Weight  Systolic BP 136 167 128  Diastolic BP 79 92 82  Wt. (Lbs) 206 207 208  BMI 31.32 kg/m2 31.47 kg/m2 31.63 kg/m2    Physical Exam Vitals and nursing note reviewed.  Constitutional:      Brandon: He is not in acute distress.    Appearance: Normal appearance.  HENT:     Head: Normocephalic and atraumatic.   Eyes:     Brandon:        Right eye: No discharge.        Left eye: No discharge.      Conjunctiva/sclera: Conjunctivae normal.    Cardiovascular:     Rate and Rhythm: Normal rate and regular rhythm.  Pulmonary:     Effort: Pulmonary effort is normal.     Breath sounds: Normal breath sounds. No wheezing, rhonchi or rales.   Neurological:     Mental Status: He is alert.   Psychiatric:        Mood and Affect: Mood normal.        Behavior: Behavior normal.     Lab Results  Component Value Date   WBC 9.8 06/26/2021   HGB 15.6 06/26/2021   HCT 45.6 06/26/2021   PLT 321.0 06/26/2021   GLUCOSE 106 (H) 03/27/2022   ALT 25 06/26/2021   AST 12 06/26/2021   NA 137 03/27/2022   K 4.1 03/27/2022   CL 106 03/27/2022   CREATININE 1.23 03/27/2022   BUN 22 (H) 03/27/2022   CO2 24 03/27/2022   TSH 3.38 06/26/2021   PSA 7.0 09/11/2023   HGBA1C 6.2 07/14/2023     Assessment & Plan:  Essential hypertension Assessment & Plan: Stable.  Continue amlodipine.   Hyperlipidemia, unspecified hyperlipidemia type Assessment & Plan: Unsure of control.  Need records from her most recent lipid panel.  Continue statin.   Gastroesophageal reflux disease without esophagitis  Benign prostatic hyperplasia, unspecified whether lower urinary tract symptoms present Assessment & Plan: Follows closely with urology.  Continue alfuzosin .   Hypothyroidism, unspecified type Assessment & Plan: Stable on levothyroxine.   Dizziness Assessment & Plan: Consistent with BPPV.  Referring to vestibular rehab.  Orders: -     Ambulatory referral to Physical Therapy  Diabetes mellitus in remission [facility]) Assessment & Plan: Last A1c was in the prediabetic range.  Will continue to monitor closely.   OSA (obstructive sleep apnea) Assessment & Plan: Stable on CPAP.   Erectile dysfunction, unspecified erectile dysfunction type    Follow-up:  6 months  September Mormile Bluford DO [facility]

## 2023-10-22 NOTE — Assessment & Plan Note (Signed)
 Follows closely with urology.  Continue alfuzosin .

## 2023-11-12 ENCOUNTER — Other Ambulatory Visit: Payer: Self-pay | Admitting: Urology

## 2023-11-26 NOTE — Progress Notes (Unsigned)
 [facility] [address] [place] [address] Phone: [phone] Subjective:   Brandon Giles, am serving as a scribe for Dr. Arthea Claudene.  I'm seeing this patient by the request  of:  Cook, Jayce G, DO  CC: Back to neck pain follow-up  Brandon Giles  Brandon Giles is a 63 y.o. male coming in with complaint of back and neck pain. OMT 09/04/2023. Patient states that he has been doing well. Pain when he works in the yard or around the house doing projects.   Having surgery in September to have lipoma in back of neck taken off. Having more headaches. Notes more scapular pain. Wants to know if all of this is connected.   Medications patient has been prescribed: None  Taking:     Lumbar x-ray previously taken in January of this year showed a grade 1 anterior listhesis of L4-L5    Reviewed prior external information including notes and imaging from previsou exam, outside providers and external EMR if available.   As well as notes that were available from care everywhere and other healthcare systems.  Past medical history, social, surgical and family history all reviewed in electronic medical record.  No pertanent information unless stated regarding to the chief complaint.   Past Medical History:  Diagnosis Date   Allergy    seasonal   Arthritis    Fingers   Bronchitis 02/2022   GERD (gastroesophageal reflux disease)    HOH (hard of hearing)    left ear   Hypertension    PONV (postoperative nausea and vomiting)    after hernia repair   Pre-diabetes    Shortness of breath    no longer a problem   Sleep apnea    CPAP    Allergies  Allergen Reactions   Hydrocodone -Acetaminophen  Other (See Comments)    Doesn't like how it makes him feel-dizzy,tachycardia     Review of Systems:  No  visual changes, nausea, vomiting, diarrhea, constipation, dizziness, abdominal pain, skin rash, fevers, chills, night sweats, weight loss, swollen  lymph nodes, body aches, joint swelling, chest pain, shortness of breath, mood changes. POSITIVE muscle aches, headache,  Objective  Blood pressure 132/74, pulse 62, height 5' 8 (1.727 m), weight 202 lb (91.6 kg), SpO2 97%.   General: No apparent distress alert and oriented x3 mood and affect normal, dressed appropriately.  HEENT: Pupils equal, extraocular movements intact  Respiratory: Patient's speak in full sentences and does not appear short of breath  Cardiovascular: No lower extremity edema, non tender, no erythema  Gait MSK:  Back does have some loss lordosis.  Tightness noted in the parascapular area.  Neck exam does have some limitation in sidebending bilaterally.  Negative Spurling's noted today.  Mild increase in muscle tightness noted.  Lipoma noted on the posterior aspect of the neck  Osteopathic findings  C2 flexed rotated and side bent right C7 flexed rotated and side bent left T3 extended rotated and side bent right inhaled rib T9 extended rotated and side bent left L2 flexed rotated and side bent right L3 flexed rotated and side bent left Sacrum right on right     Assessment and Plan:  Lumbar pain Increasing in discomfort again today.  Patient has pain that does seem to be somewhat out of proportion.  Has had elevated PSA previously as well with difficulty with benign prostate hypertrophy.  Will get labs including PSA again today.  Lymph node rule out autoimmune that could  be potentially contributing as well.  Discussed icing regimen of home exercises, follow-up with me again in 6 to 8 weeks otherwise.    Nonallopathic problems  Decision today to treat with OMT was based on Physical Exam  After verbal consent patient was treated with HVLA, ME, FPR techniques in cervical, rib, thoracic, lumbar, and sacral  areas  Patient tolerated the procedure well with improvement in symptoms  Patient given exercises, stretches and lifestyle modifications  See medications  in patient instructions if given  Patient will follow up in 4-8 weeks    The above documentation has been reviewed and is accurate and complete Jakyiah Briones M Celines Femia, DO          Note: This dictation was prepared with Dragon dictation along with smaller phrase technology. Any transcriptional errors that result from this process are unintentional.

## 2023-11-27 ENCOUNTER — Encounter: Payer: Self-pay | Admitting: Family Medicine

## 2023-11-27 ENCOUNTER — Ambulatory Visit: Admitting: Family Medicine

## 2023-11-27 ENCOUNTER — Ambulatory Visit: Payer: Self-pay | Admitting: Family Medicine

## 2023-11-27 VITALS — BP 132/74 | HR 62 | Ht 68.0 in | Wt 202.0 lb

## 2023-11-27 DIAGNOSIS — M9908 Segmental and somatic dysfunction of rib cage: Secondary | ICD-10-CM | POA: Diagnosis not present

## 2023-11-27 DIAGNOSIS — M9902 Segmental and somatic dysfunction of thoracic region: Secondary | ICD-10-CM

## 2023-11-27 DIAGNOSIS — M2550 Pain in unspecified joint: Secondary | ICD-10-CM | POA: Diagnosis not present

## 2023-11-27 DIAGNOSIS — M9903 Segmental and somatic dysfunction of lumbar region: Secondary | ICD-10-CM

## 2023-11-27 DIAGNOSIS — M9901 Segmental and somatic dysfunction of cervical region: Secondary | ICD-10-CM | POA: Diagnosis not present

## 2023-11-27 DIAGNOSIS — M545 Low back pain, unspecified: Secondary | ICD-10-CM

## 2023-11-27 DIAGNOSIS — M9904 Segmental and somatic dysfunction of sacral region: Secondary | ICD-10-CM

## 2023-11-27 LAB — COMPREHENSIVE METABOLIC PANEL WITH GFR
ALT: 25 U/L (ref 0–53)
AST: 14 U/L (ref 0–37)
Albumin: 5 g/dL (ref 3.5–5.2)
Alkaline Phosphatase: 60 U/L (ref 39–117)
BUN: 17 mg/dL (ref 6–23)
CO2: 29 meq/L (ref 19–32)
Calcium: 9.9 mg/dL (ref 8.4–10.5)
Chloride: 103 meq/L (ref 96–112)
Creatinine, Ser: 1.33 mg/dL (ref 0.40–1.50)
GFR: 57.74 mL/min — ABNORMAL LOW (ref >60.00)
Glucose, Bld: 115 mg/dL — ABNORMAL HIGH (ref 70–99)
Potassium: 4.5 meq/L (ref 3.5–5.1)
Sodium: 140 meq/L (ref 135–145)
Total Bilirubin: 0.7 mg/dL (ref 0.2–1.2)
Total Protein: 7.2 g/dL (ref 6.0–8.3)

## 2023-11-27 LAB — CBC WITH DIFFERENTIAL/PLATELET
Basophils Absolute: 0.1 K/uL (ref 0.0–0.1)
Basophils Relative: 1.1 % (ref 0.0–3.0)
Eosinophils Absolute: 0.2 K/uL (ref 0.0–0.7)
Eosinophils Relative: 2.5 % (ref 0.0–5.0)
HCT: 47.2 % (ref 39.0–52.0)
Hemoglobin: 16.1 g/dL (ref 13.0–17.0)
Lymphocytes Relative: 22 % (ref 12.0–46.0)
Lymphs Abs: 1.5 K/uL (ref 0.7–4.0)
MCHC: 34.1 g/dL (ref 30.0–36.0)
MCV: 86.3 fl (ref 78.0–100.0)
Monocytes Absolute: 0.6 K/uL (ref 0.1–1.0)
Monocytes Relative: 7.9 % (ref 3.0–12.0)
Neutro Abs: 4.6 K/uL (ref 1.4–7.7)
Neutrophils Relative %: 66.5 % (ref 43.0–77.0)
Platelets: 310 K/uL (ref 150.0–400.0)
RBC: 5.47 Mil/uL (ref 4.22–5.81)
RDW: 12.8 % (ref 11.5–15.5)
WBC: 7 K/uL (ref 4.0–10.5)

## 2023-11-27 LAB — SEDIMENTATION RATE: Sed Rate: 11 mm/h (ref 0–20)

## 2023-11-27 LAB — VITAMIN D 25 HYDROXY (VIT D DEFICIENCY, FRACTURES): VITD: 48.86 ng/mL (ref 30.00–100.00)

## 2023-11-27 LAB — IBC PANEL
Iron: 125 ug/dL (ref 42–165)
Saturation Ratios: 33.9 % (ref 20.0–50.0)
TIBC: 368.2 ug/dL (ref 250.0–450.0)
Transferrin: 263 mg/dL (ref 212.0–360.0)

## 2023-11-27 LAB — TSH: TSH: 3.19 u[IU]/mL (ref 0.35–5.50)

## 2023-11-27 LAB — VITAMIN B12: Vitamin B-12: 346 pg/mL (ref 211–911)

## 2023-11-27 LAB — FERRITIN: Ferritin: 43.3 ng/mL (ref 22.0–322.0)

## 2023-11-27 LAB — TESTOSTERONE: Testosterone: 177.76 ng/dL — ABNORMAL LOW (ref 300.00–890.00)

## 2023-11-27 LAB — C-REACTIVE PROTEIN: CRP: <1 mg/dL (ref 0.5–20.0)

## 2023-11-27 LAB — URIC ACID: Uric Acid, Serum: 7.2 mg/dL (ref 4.0–7.8)

## 2023-11-27 LAB — PSA: PSA: 7.37 ng/mL — ABNORMAL HIGH (ref 0.10–4.00)

## 2023-11-27 NOTE — Patient Instructions (Signed)
 Labs psa lipids Get lipoma removed Let's give it 2 months and then we can discuss MRI for blood supply See me in 3 months

## 2023-11-27 NOTE — Assessment & Plan Note (Signed)
 Increasing in discomfort again today.  Patient has pain that does seem to be somewhat out of proportion.  Has had elevated PSA previously as well with difficulty with benign prostate hypertrophy.  Will get labs including PSA again today.  Lymph node rule out autoimmune that could be potentially contributing as well.  Discussed icing regimen of home exercises, follow-up with me again in 6 to 8 weeks otherwise.

## 2023-11-30 LAB — CYCLIC CITRUL PEPTIDE ANTIBODY, IGG: Cyclic Citrullin Peptide Ab: <16 U

## 2023-11-30 LAB — LIPID PANEL
Cholesterol: 182 mg/dL (ref <200)
HDL: 36 mg/dL — ABNORMAL LOW (ref >=40)
LDL Cholesterol (Calc): 121 mg/dL — ABNORMAL HIGH
Non-HDL Cholesterol (Calc): 146 mg/dL — ABNORMAL HIGH (ref <130)
Total CHOL/HDL Ratio: 5.1 (calc) — ABNORMAL HIGH (ref <5.0)
Triglycerides: 132 mg/dL (ref <150)

## 2023-11-30 LAB — RHEUMATOID FACTOR: Rheumatoid fact SerPl-aCnc: <10 [IU]/mL (ref <14)

## 2023-11-30 LAB — PTH, INTACT AND CALCIUM
Calcium: 9.9 mg/dL (ref 8.6–10.3)
PTH: 44 pg/mL (ref 16–77)

## 2023-11-30 LAB — ANTI-NUCLEAR AB-TITER (ANA TITER): ANA Titer 1: 1:80 {titer} — ABNORMAL HIGH

## 2023-11-30 LAB — ANGIOTENSIN CONVERTING ENZYME: Angiotensin-Converting Enzyme: 29 U/L (ref 9–67)

## 2023-11-30 LAB — ANA: Anti Nuclear Antibody (ANA): POSITIVE — AB

## 2023-11-30 LAB — CALCIUM, IONIZED: Calcium, Ion: 5.2 mg/dL (ref 4.7–5.5)

## 2023-12-08 NOTE — Therapy (Signed)
 OUTPATIENT PHYSICAL THERAPY VESTIBULAR EVALUATION     Patient Name: Brandon Giles MRN: [id number] DOB:04-28-1961, 63 y.o., male Today's Date: 12/09/2023  END OF SESSION:  PT End of Session - 12/09/23 0801     Visit Number 1    Number of Visits 3    Date for PT Re-Evaluation 01/06/24    Authorization Type UHC    PT Start Time 0801    PT Stop Time 0841    PT Time Calculation (min) 40 min    Activity Tolerance Patient tolerated treatment well    Behavior During Therapy [facility] for tasks assessed/performed          Past Medical History:  Diagnosis Date   Allergy    seasonal   Arthritis    Fingers   Bronchitis 02/2022   GERD (gastroesophageal reflux disease)    HOH (hard of hearing)    left ear   Hypertension    PONV (postoperative nausea and vomiting)    after hernia repair   Pre-diabetes    Shortness of breath    no longer a problem   Sleep apnea    CPAP   Past Surgical History:  Procedure Laterality Date   COLONOSCOPY  07/15/2013   Dr.Brodie   GANGLION CYST EXCISION  12/25/2011   Procedure: REMOVAL GANGLION OF WRIST;  Surgeon: Lynwood SHAUNNA Bern, MD;  Location: [facility];  Service: Orthopedics;  Laterality: Left;  excision of ganglion cyst of left wrist    HERNIA REPAIR  mid '90's   bilateral ing   KNEE ARTHROSCOPY  '99   left    NASAL SEPTOPLASTY W/ TURBINOPLASTY Bilateral 06/04/2013   Procedure: BILATERNAL NASAL SEPTOPLASTY WITH TURBINATE REDUCTION;  Surgeon: Ida Loader, MD;  Location: [facility];  Service: ENT;  Laterality: Bilateral;   SEPTOPLASTY     TONSILLECTOMY     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) 10/22/2023   OSA (obstructive sleep apnea) 10/22/2023   Erectile dysfunction 10/22/2023   Dizziness 10/22/2023   Hyperlipidemia 10/21/2023   GERD (gastroesophageal reflux disease) 10/21/2023   Essential hypertension 10/21/2023   BPH (benign prostatic  hyperplasia) 10/21/2023   Hypothyroidism 10/21/2023   Lumbar pain 05/23/2023    PCP: Cook, Jayce G, DO REFERRING PROVIDER: Cook, Jayce G, DO  REFERRING DIAG: R42 (ICD-10-CM) - Dizziness  THERAPY DIAG:  Dizziness and giddiness - Plan: PT plan of care cert/re-cert  ONSET DATE: couple months ago  Rationale for Evaluation and Treatment: Rehabilitation  SUBJECTIVE:   SUBJECTIVE STATEMENT: Get dizzy climbing ladders; retired in 2019; 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  Noted that he now is only on one.  He gets a spinning sensation and sometimes gets nauseated and lightheaded when climbing up a ladder to get to top on camper and once had this when trying to get out of bed.  Has noticed some increased in head ache Pt accompanied by: self  PERTINENT HISTORY: HTN, DM History of chronic low back pain Cervical lymphoma    PAIN:  Are you having pain? No  PRECAUTIONS: None  RED FLAGS: None   WEIGHT BEARING RESTRICTIONS: No  FALLS: Has patient fallen in last 6 months? No   PLOF: Independent  PATIENT GOALS: no dizziness  OBJECTIVE:  Note: Objective measures were completed at Evaluation unless otherwise noted.  DIAGNOSTIC FINDINGS:   COGNITION: Overall cognitive status: Within functional limits for tasks assessed  SENSATION: WFL; some N/T in hands and fingers occasionally  EDEMA:   POSTURE:  No Significant postural limitations  Cervical ROM:  lymphoma in neck getting cut out in sept  Active AROM (deg) eval  Flexion ful  Extension Mild report of dizziness at end range  Right lateral flexion   Left lateral flexion   Right rotation full  Left rotation full  (Blank rows = not tested)  STRENGTH:   LOWER EXTREMITY MMT:   MMT Right eval Left eval  Hip flexion    Hip abduction    Hip adduction    Hip internal rotation    Hip external rotation    Knee flexion    Knee extension    Ankle dorsiflexion    Ankle  plantarflexion    Ankle inversion    Ankle eversion    (Blank rows = not tested)  BED MOBILITY:  Sit to supine Complete Independence Supine to sit Complete Independence  TRANSFERS: Assistive device utilized: None  Sit to stand: Complete Independence Stand to sit: Complete Independence Chair to chair: Complete Independence Floor: not tested  GAIT: Gait pattern: WFL Distance walked: 60 ft in clinic Assistive device utilized: None Level of assistance: Complete Independence Comments: no gait deviations noted  FUNCTIONAL TESTS:    PATIENT SURVEYS:  DHI: THE DIZZINESS HANDICAP INVENTORY (DHI)                                                                             TOTAL 30/100    DHI Scoring Instructions  The patient is asked to answer each question as it pertains to dizziness or unsteadiness problems, specifically  considering their condition during the last month. Questions are designed to incorporate functional (F), physical  (P), and emotional (E) impacts on disability.   Scores greater than 10 points should be referred to balance specialists for further evaluation.   16-34 Points (mild handicap)  36-52 Points (moderate handicap)  54+ Points (severe handicap)  Minimally Detectable Change: 17 points ([address] [place], 1990)  Mine [place], G. SHAUNNA. and Niland, C. W. (1990). The development of the Dizziness Handicap Inventory. Archives of Otolaryngology - Head and Neck Surgery 116(4): [id number].   VESTIBULAR ASSESSMENT:  GENERAL OBSERVATION: glasses   SYMPTOM BEHAVIOR:  Subjective history: see above  Non-Vestibular symptoms: neck pain, headaches, and nausea/vomiting  Type of dizziness: Spinning/Vertigo and Lightheadedness/Faint  Frequency: once every couple weeks  Duration: brief; 15 sec or so  Aggravating factors: Induced by position change: rolling to the right and rolling to the left and Induced by motion: looking up at the ceiling and  turning head quickly  Relieving factors: head stationary, closing eyes, and rest  Progression of symptoms: unchanged  OCULOMOTOR EXAM:  Ocular Alignment: normal  Ocular ROM: No Limitations  Spontaneous Nystagmus: absent  Gaze-Induced Nystagmus: absent  Smooth Pursuits: intact  Saccades: intact  Convergence/Divergence: not tested    VESTIBULAR - OCULAR REFLEX:   Slow VOR: Normal  VOR Cancellation: Normal  Head-Impulse Test: not tested  Dynamic Visual Acuity: not assessed   POSITIONAL TESTING: Right Dix-Hallpike: no nystagmus Left Dix-Hallpike: no nystagmus Right Roll Test: no nystagmus Left Roll Test: no nystagmus  MOTION SENSITIVITY:  Motion Sensitivity Quotient Intensity:  0 = none, 1 = Lightheaded, 2 = Mild, 3 = Moderate, 4 = Severe, 5 = Vomiting  Intensity  1. Sitting to supine   2. Supine to L side   3. Supine to R side   4. Supine to sitting   5. L Hallpike-Dix 0  6. Up from L  0  7. R Hallpike-Dix 0  8. Up from R  0  9. Sitting, head tipped to L knee   10. Head up from L knee   11. Sitting, head tipped to R knee   12. Head up from R knee   13. Sitting head turns x5   14.Sitting head nods x5   15. In stance, 180 turn to L    16. In stance, 180 turn to R     OTHOSTATICS:  supine 174/88    Sitting 160/84    Standing 142/82      FUNCTIONAL GAIT:                                                                                                                              TREATMENT DATE: 12/08/23 physical therapy evaluation and HEP instruction   Canalith Repositioning:  Not done as no nystagmus with positional testing today Gaze Adaptation:   Habituation:   Other:   PATIENT EDUCATION: Education details: Patient educated on exam findings, POC, scope of PT, HEP, and what to expect next visit. Person educated: Patient Education method: Explanation, Demonstration, and Handouts Education comprehension: verbalized understanding, returned demonstration,  verbal cues required, and tactile cues required  HOME EXERCISE PROGRAM: Access Code: [email or web address] URL: [email or web address] Date: 12/09/2023 Prepared by: AP - Rehab  Exercises - Self-Epley Maneuver Right Ear  - 1 x daily - 7 x weekly - 1 sets - 2 reps - Self-Epley Maneuver Left Ear  - 1 x daily - 7 x weekly - 1 sets - 2 reps GOALS: Goals reviewed with patient? No  SHORT TERM GOALS: Target date: 12/23/2023  patient will be independent with initial HEP  Baseline: Goal status: INITIAL   LONG TERM GOALS: Target date: 01/06/2024  Patient will be independent in self management strategies to improve quality of life and functional outcomes.  Baseline:  Goal status: INITIAL  2.  Patient will report 70% improvement overall  Baseline:  Goal status: INITIAL  3.  Patient will improve DHI score by 15 points to demonstrate improved perceived function  Baseline: 30/100 Goal status: INITIAL  ASSESSMENT:  CLINICAL IMPRESSION: Patient is a 63 y.o. male who was seen today for physical therapy evaluation and treatment for dizziness. Patient reports increased vestibular symptoms but unable to reproduce today with provocative testing which is negatively impacting patient ability to perform ADLs and functional mobility tasks. Patient will benefit from skilled physical therapy services to address these deficits to improve level of function with ADLs, functional mobility tasks, and reduce risk for falls.    OBJECTIVE IMPAIRMENTS: decreased activity tolerance, dizziness, and impaired  perceived functional ability.   ACTIVITY LIMITATIONS: bending and locomotion level  PARTICIPATION LIMITATIONS: cleaning, community activity, and yard work  [facility] POTENTIAL: Good  CLINICAL DECISION MAKING: Evolving/moderate complexity  EVALUATION COMPLEXITY: Moderate   PLAN:  PT FREQUENCY: 1x/week  PT DURATION: 4 weeks  PLANNED INTERVENTIONS: 97164- PT Re-evaluation, 97110-Therapeutic  exercises, 97530- Therapeutic activity, [id number]- Neuromuscular re-education, 97535- Self Care, [id number]- Manual therapy, [id number]- Gait training, [phone]- Orthotic Fit/training, [phone]- Canalith repositioning, [id number]- Aquatic Therapy, [id number]- Splinting, [id number]- Wound care (first 20 sq cm), 97598- Wound care (each additional 20 sq cm)Patient/Family education, Balance training, Stair training, Taping, Dry Needling, Joint mobilization, Joint manipulation, Spinal manipulation, Spinal mobilization, Scar mobilization, and DME instructions.   PLAN FOR NEXT SESSION: Review HEP and goals; retest canals if returns with continued symptoms of spinning. Unable to reproduce at eval.  Was positive for orthostatic hypotension   9:15 AM, 12/09/23 Hiep Ollis Small Ranell Skibinski MPT [facility] [place] [phone]

## 2023-12-09 ENCOUNTER — Other Ambulatory Visit: Payer: Self-pay

## 2023-12-09 ENCOUNTER — Ambulatory Visit (HOSPITAL_COMMUNITY): Attending: Family Medicine

## 2023-12-09 DIAGNOSIS — R42 Dizziness and giddiness: Secondary | ICD-10-CM | POA: Diagnosis present

## 2023-12-30 ENCOUNTER — Telehealth: Payer: Self-pay

## 2023-12-30 ENCOUNTER — Ambulatory Visit

## 2023-12-30 ENCOUNTER — Ambulatory Visit: Admitting: Urology

## 2023-12-30 DIAGNOSIS — N138 Other obstructive and reflux uropathy: Secondary | ICD-10-CM

## 2023-12-30 DIAGNOSIS — R3914 Feeling of incomplete bladder emptying: Secondary | ICD-10-CM

## 2023-12-30 LAB — BLADDER SCAN AMB NON-IMAGING: Scan Result: 0

## 2023-12-30 LAB — URINALYSIS, ROUTINE W REFLEX MICROSCOPIC
Bilirubin, UA: NEGATIVE
Glucose, UA: NEGATIVE
Ketones, UA: NEGATIVE
Leukocytes,UA: NEGATIVE
Nitrite, UA: NEGATIVE
Protein,UA: NEGATIVE
RBC, UA: NEGATIVE
Specific Gravity, UA: 1.015 (ref 1.005–1.030)
Urobilinogen, Ur: 0.2 mg/dL (ref 0.2–1.0)
pH, UA: 6.5 (ref 5.0–7.5)

## 2023-12-30 NOTE — Telephone Encounter (Signed)
 Open in error

## 2023-12-30 NOTE — Addendum Note (Signed)
 Addended by: GRETTA MASTERS R on: 12/30/2023 01:55 PM   Modules accepted: Orders

## 2023-12-30 NOTE — Progress Notes (Signed)
 Pt came in today for PVR check. -incomplete emptying. Start and Stop during urination.   Bladder Scan completed today.  Patient can void prior to the bladder scan. Bladder scan result: 0  Performed By: Carlos, CMA  Additional notes-  Pt state's he feels like he is voiding through a straw at times. Pt is aware Dr. Matilda will be informed and someone will reach out with his recommendation.

## 2024-01-01 ENCOUNTER — Ambulatory Visit (HOSPITAL_COMMUNITY): Attending: Family Medicine

## 2024-01-01 DIAGNOSIS — R42 Dizziness and giddiness: Secondary | ICD-10-CM | POA: Diagnosis present

## 2024-01-01 NOTE — Therapy (Signed)
 OUTPATIENT PHYSICAL THERAPY VESTIBULAR TREATMENT/DISCHARGE PHYSICAL THERAPY DISCHARGE SUMMARY  Visits from Start of Care: 2  Current functional level related to goals / functional outcomes: See below   Remaining deficits: See below   Education / Equipment: HEP   Patient agrees to discharge. Patient goals were met. Patient is being discharged due to meeting the stated rehab goals.      Patient Name: Brandon Giles MRN: [id number] DOB:1960/07/05, 63 y.o., male Today's Date: 01/01/2024  END OF SESSION:  PT End of Session - 01/01/24 0719     Visit Number 2    Number of Visits 3    Date for PT Re-Evaluation 01/06/24    Authorization Type [employer]    PT Start Time 0717    PT Stop Time 0742    PT Time Calculation (min) 25 min    Activity Tolerance Patient tolerated treatment well    Behavior During Therapy [facility] for tasks assessed/performed          Past Medical History:  Diagnosis Date   Allergy    seasonal   Arthritis    Fingers   Bronchitis 02/2022   GERD (gastroesophageal reflux disease)    HOH (hard of hearing)    left ear   Hypertension    PONV (postoperative nausea and vomiting)    after hernia repair   Pre-diabetes    Shortness of breath    no longer a problem   Sleep apnea    CPAP   Past Surgical History:  Procedure Laterality Date   COLONOSCOPY  07/15/2013   Dr.Brodie   GANGLION CYST EXCISION  12/25/2011   Procedure: REMOVAL GANGLION OF WRIST;  Surgeon: Lynwood SHAUNNA Bern, MD;  Location: [facility];  Service: Orthopedics;  Laterality: Left;  excision of ganglion cyst of left wrist    HERNIA REPAIR  mid '90's   bilateral ing   KNEE ARTHROSCOPY  '99   left    NASAL SEPTOPLASTY W/ TURBINOPLASTY Bilateral 06/04/2013   Procedure: BILATERNAL NASAL SEPTOPLASTY WITH TURBINATE REDUCTION;  Surgeon: Ida Loader, MD;  Location: [facility];  Service: ENT;  Laterality: Bilateral;   SEPTOPLASTY     TONSILLECTOMY     Patient  Active Problem List   Diagnosis Date Noted   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) 10/22/2023   OSA (obstructive sleep apnea) 10/22/2023   Erectile dysfunction 10/22/2023   Dizziness 10/22/2023   Hyperlipidemia 10/21/2023   GERD (gastroesophageal reflux disease) 10/21/2023   Essential hypertension 10/21/2023   BPH (benign prostatic hyperplasia) 10/21/2023   Hypothyroidism 10/21/2023   Lumbar pain 05/23/2023    PCP: Cook, Jayce G, DO REFERRING PROVIDER: Cook, Jayce G, DO  REFERRING DIAG: R42 (ICD-10-CM) - Dizziness  THERAPY DIAG:  Dizziness and giddiness  ONSET DATE: couple months ago  Rationale for Evaluation and Treatment: Rehabilitation  SUBJECTIVE:   SUBJECTIVE STATEMENT: Reports no dizzinesss since last visit; BP has been regulated   EVAL:Get dizzy climbing ladders; retired in 2019; 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  Noted that he now is only on one.  He gets a spinning sensation and sometimes gets nauseated and lightheaded when climbing up a ladder to get to top on camper and once had this when trying to get out of bed.  Has noticed some increased in head ache Pt accompanied by: self  PERTINENT HISTORY: HTN, DM History of chronic low back pain Cervical lymphoma    PAIN:  Are you having pain? No  PRECAUTIONS: None  RED FLAGS: None   WEIGHT BEARING RESTRICTIONS: No  FALLS: Has patient fallen in last 6 months? No   PLOF: Independent  PATIENT GOALS: no dizziness  OBJECTIVE:  Note: Objective measures were completed at Evaluation unless otherwise noted.  DIAGNOSTIC FINDINGS:   COGNITION: Overall cognitive status: Within functional limits for tasks assessed   SENSATION: WFL; some N/T in hands and fingers occasionally  EDEMA:   POSTURE:  No Significant postural limitations  Cervical ROM:  lymphoma in neck getting cut out in sept  Active AROM (deg) eval  Flexion ful  Extension Mild report of dizziness at end  range  Right lateral flexion   Left lateral flexion   Right rotation full  Left rotation full  (Blank rows = not tested)  STRENGTH:   LOWER EXTREMITY MMT:   MMT Right eval Left eval  Hip flexion    Hip abduction    Hip adduction    Hip internal rotation    Hip external rotation    Knee flexion    Knee extension    Ankle dorsiflexion    Ankle plantarflexion    Ankle inversion    Ankle eversion    (Blank rows = not tested)  BED MOBILITY:  Sit to supine Complete Independence Supine to sit Complete Independence  TRANSFERS: Assistive device utilized: None  Sit to stand: Complete Independence Stand to sit: Complete Independence Chair to chair: Complete Independence Floor: not tested  GAIT: Gait pattern: WFL Distance walked: 60 ft in clinic Assistive device utilized: None Level of assistance: Complete Independence Comments: no gait deviations noted  FUNCTIONAL TESTS:    PATIENT SURVEYS:  DHI: THE DIZZINESS HANDICAP INVENTORY (DHI)                                                                             TOTAL 30/100    DHI Scoring Instructions  The patient is asked to answer each question as it pertains to dizziness or unsteadiness problems, specifically  considering their condition during the last month. Questions are designed to incorporate functional (F), physical  (P), and emotional (E) impacts on disability.   Scores greater than 10 points should be referred to balance specialists for further evaluation.   16-34 Points (mild handicap)  36-52 Points (moderate handicap)  54+ Points (severe handicap)  Minimally Detectable Change: 17 points ([address] [place], 1990)  [place], G. SHAUNNA. and [place], C. W. (1990). The development of the Dizziness Handicap Inventory. Archives of Otolaryngology - Head and Neck Surgery 116(4): [id number].   VESTIBULAR ASSESSMENT:  GENERAL OBSERVATION: glasses   SYMPTOM BEHAVIOR:  Subjective history:  see above  Non-Vestibular symptoms: neck pain, headaches, and nausea/vomiting  Type of dizziness: Spinning/Vertigo and Lightheadedness/Faint  Frequency: once every couple weeks  Duration: brief; 15 sec or so  Aggravating factors: Induced by position change: rolling to the right and rolling to the left and Induced by motion: looking up at the ceiling and turning head quickly  Relieving factors: head stationary, closing eyes, and rest  Progression of symptoms: unchanged  OCULOMOTOR EXAM:  Ocular Alignment: normal  Ocular ROM: No Limitations  Spontaneous Nystagmus: absent  Gaze-Induced Nystagmus: absent  Smooth Pursuits: intact  Saccades: intact  Convergence/Divergence: not tested    VESTIBULAR - OCULAR REFLEX:   Slow VOR: Normal  VOR Cancellation: Normal  Head-Impulse Test: not tested  Dynamic Visual Acuity: not assessed   POSITIONAL TESTING: Right Dix-Hallpike: no nystagmus Left Dix-Hallpike: no nystagmus Right Roll Test: no nystagmus Left Roll Test: no nystagmus  MOTION SENSITIVITY:  Motion Sensitivity Quotient Intensity: 0 = none, 1 = Lightheaded, 2 = Mild, 3 = Moderate, 4 = Severe, 5 = Vomiting  Intensity  1. Sitting to supine   2. Supine to L side   3. Supine to R side   4. Supine to sitting   5. L Hallpike-Dix 0  6. Up from L  0  7. R Hallpike-Dix 0  8. Up from R  0  9. Sitting, head tipped to L knee   10. Head up from L knee   11. Sitting, head tipped to R knee   12. Head up from R knee   13. Sitting head turns x5   14.Sitting head nods x5   15. In stance, 180 turn to L    16. In stance, 180 turn to R     OTHOSTATICS:  supine 174/88    Sitting 160/84    Standing 142/82      FUNCTIONAL GAIT:                                                                                                                              TREATMENT DATE 01/01/24 Review of HEP and goals Retest of posterior canals negative bilaterally Demonstration of Semont Discussion of  what to do should symtoms of BPPV recur   12/08/23 physical therapy evaluation and HEP instruction   Canalith Repositioning:  Not done as no nystagmus with positional testing today Gaze Adaptation:   Habituation:   Other:   PATIENT EDUCATION: Education details: Patient educated on exam findings, POC, scope of PT, HEP, and what to expect next visit. Person educated: Patient Education method: Explanation, Demonstration, and Handouts Education comprehension: verbalized understanding, returned demonstration, verbal cues required, and tactile cues required  HOME EXERCISE PROGRAM: Access Code: [email or web address] URL: [email or web address] Date: 01/01/2024 Patient Education - Right Semont - Left Semont Access Code: [facility] URL: [email or web address] Date: 12/09/2023 Prepared by: AP - Rehab  Exercises - Self-Epley Maneuver Right Ear  - 1 x daily - 7 x weekly - 1 sets - 2 reps - Self-Epley Maneuver Left Ear  - 1 x daily - 7 x weekly - 1 sets - 2 reps GOALS: Goals reviewed with patient? No  SHORT TERM GOALS: Target date: 12/23/2023  patient will be independent with initial HEP  Baseline: Goal status: MET   LONG TERM GOALS: Target date: 01/06/2024  Patient will be independent in self management strategies to improve quality of life and functional outcomes.  Baseline:  Goal status: MET  2.  Patient will report 70% improvement overall  Baseline:  Goal status: MET  3.  Patient will improve DHI  score by 15 points to demonstrate improved perceived function  Baseline: 30/100; 8/100  Goal status: MET  ASSESSMENT:  CLINICAL IMPRESSION Started session with a review of goals; patient verbalizes agreement.  Patient negative with retesting of posterior canals bilaterally for BPPV.  Updated HEP and discussed what to do in case of recurrence of BPPV symptoms.  Patient with improved score on DHI; decreased by 22 points.  Patient is agreeable to discharge at  this time.     Eval:Patient is a 63 y.o. male who was seen today for physical therapy evaluation and treatment for dizziness. Patient reports increased vestibular symptoms but unable to reproduce today with provocative testing which is negatively impacting patient ability to perform ADLs and functional mobility tasks. Patient will benefit from skilled physical therapy services to address these deficits to improve level of function with ADLs, functional mobility tasks, and reduce risk for falls.    OBJECTIVE IMPAIRMENTS: decreased activity tolerance, dizziness, and impaired perceived functional ability.   ACTIVITY LIMITATIONS: bending and locomotion level  PARTICIPATION LIMITATIONS: cleaning, community activity, and yard work  [facility] POTENTIAL: Good  CLINICAL DECISION MAKING: Evolving/moderate complexity  EVALUATION COMPLEXITY: Moderate   PLAN:  PT FREQUENCY: 1x/week  PT DURATION: 4 weeks  PLANNED INTERVENTIONS: 97164- PT Re-evaluation, 97110-Therapeutic exercises, 97530- Therapeutic activity, [id number]- Neuromuscular re-education, 97535- Self Care, 02859- Manual therapy, [id number]- Gait training, [phone]- Orthotic Fit/training, [phone]- Canalith repositioning, [id number]- Aquatic Therapy, [id number]- Splinting, [id number]- Wound care (first 20 sq cm), 97598- Wound care (each additional 20 sq cm)Patient/Family education, Balance training, Stair training, Taping, Dry Needling, Joint mobilization, Joint manipulation, Spinal manipulation, Spinal mobilization, Scar mobilization, and DME instructions.   PLAN FOR NEXT SESSION: discharge i  7:47 AM, 01/01/24 Annissa Andreoni Small Hallelujah Wysong MPT [place] [facility] [place] [phone]

## 2024-01-08 ENCOUNTER — Ambulatory Visit (INDEPENDENT_AMBULATORY_CARE_PROVIDER_SITE_OTHER): Admitting: General Surgery

## 2024-01-08 ENCOUNTER — Encounter: Payer: Self-pay | Admitting: General Surgery

## 2024-01-08 VITALS — BP 160/94 | HR 55 | Temp 98.3°F | Resp 16 | Ht 68.0 in | Wt 204.0 lb

## 2024-01-08 DIAGNOSIS — D170 Benign lipomatous neoplasm of skin and subcutaneous tissue of head, face and neck: Secondary | ICD-10-CM

## 2024-01-08 MED ORDER — OXYCODONE HCL 5 MG PO TABS: 5.0000 mg | ORAL_TABLET | ORAL | 0 refills | Status: DC | PRN

## 2024-01-08 NOTE — Patient Instructions (Signed)
 Discharge Instructions:  Common Complaints: Pain at the incision site is common.  Bruising can occur.    Diet/ Activity: Leave the bandage in place until Saturday evening unless it is saturated with blood. If saturated you can replace the dressing. If the area is bleeding, hold firm pressure on the area for 10 minutes straight. This should stop any bleeding.  You can bird bath or shower and try to avoid getting back of neck wet until Saturday. After Saturday, shower per your regular routine daily.  Limit excessive movement, lifting > 20 lbs, stretching the incision excessively.  You can leave the area open after the 48 hours or you can replace the bandage/large bandaid and use neosporin on the area.  Keep the area clean and dry with a dry bandage.   Medication: Take tylenol  and ibuprofen as needed for pain control, alternating every 4-6 hours.  Example:  Tylenol  1000mg  @ 6am, 12noon, 6pm, (Do not exceed 4000mg  of tylenol  a day). Ibuprofen 800mg  @ 9am, 3pm, 9pm, 3am (Do not exceed 3600mg  of ibuprofen a day).  Take Roxicodone  for breakthrough pain every 4 hours.  Take Colace for constipation related to narcotic pain medication. If you do not have a bowel movement in 2 days, take Miralax over the counter.  Drink plenty of water to also prevent constipation.   Contact Information: If you have questions or concerns, please call our office, [phone], Monday- Thursday 8AM-5PM and Friday 8AM-12Noon.  If it is after hours or on the weekend, please call [facility], [phone], [phone] and ask to speak to the surgeon on call for Dr. Kallie at [facility]

## 2024-01-08 NOTE — Progress Notes (Unsigned)
 [facility] Procedure Note  01/09/24  Pre-procedure Diagnosis:  Lipoma posterior neck   Post-procedure Diagnosis: Same   Procedure(s) Performed: Excision of lipoma posterior neck 3cm   Surgeon: Manuelita BROCKS. Kallie, MD   Assistants: No qualified resident was available    Anesthesia: Lidocaine  1%    Specimens:  Lipoma    Estimated Blood Loss: Minimal  Wound Class: Clean   Procedure Indications: Brandon Giles is a 63 yo who has a lipoma on his left posterior neck. Discussed risk of bleeding, infection, and finding something unexpected.   Findings: Lipoma    Procedure: The patient was taken to the procedure room and placed prone. The neck was prepared and draped in the usual sterile fashion. Lidocaine  1% was injected. An incision was made and carried down to the lipoma. The lipoma was excised with sharp dissection with scissors. Excising the 3cm lipoma. Vicryl 3-0 was used to close the deep space, and the skin was closed with 3-0 Nylon interrupted.   Neosporin and bandage placed.   Final inspection revealed acceptable hemostasis. The patient tolerated the procedure well.   Future Appointments  Date Time Provider Department Center  01/20/2024  8:45 AM Kallie Manuelita BROCKS, MD [facility] None  02/26/2024  9:00 AM Claudene Arthea HERO, DO [facility] None  03/24/2024  8:30 AM [facility] None  04/06/2024  3:30 PM Matilda Senior, MD [facility] None      Manuelita Kallie, MD [facility] [address] [place] [address], [place] [address] [phone] (office)    Future Appointments  Date Time Provider Department Center  01/21/2024 10:30 AM Kallie Manuelita BROCKS, MD [facility] None  02/26/2024  9:00 AM Claudene Arthea HERO, DO [facility] None  03/24/2024  8:30 AM [facility] None  04/06/2024  3:30 PM Matilda Senior, MD [facility] None

## 2024-01-13 LAB — TISSUE SPECIMEN

## 2024-01-13 LAB — PATHOLOGY REPORT

## 2024-01-14 ENCOUNTER — Ambulatory Visit: Payer: Self-pay | Admitting: General Surgery

## 2024-01-15 ENCOUNTER — Ambulatory Visit: Admitting: Physician Assistant

## 2024-01-20 ENCOUNTER — Encounter: Payer: Self-pay | Admitting: General Surgery

## 2024-01-20 ENCOUNTER — Ambulatory Visit (INDEPENDENT_AMBULATORY_CARE_PROVIDER_SITE_OTHER): Admitting: General Surgery

## 2024-01-20 ENCOUNTER — Other Ambulatory Visit: Payer: Self-pay

## 2024-01-20 VITALS — BP 154/89 | HR 66 | Temp 97.9°F | Resp 16 | Ht 68.0 in | Wt 204.0 lb

## 2024-01-20 DIAGNOSIS — D170 Benign lipomatous neoplasm of skin and subcutaneous tissue of head, face and neck: Secondary | ICD-10-CM

## 2024-01-20 NOTE — Progress Notes (Signed)
 Patient seen in office for suture removal to back of neck.   Patient tolerated removal well.   Notified of pathology results.

## 2024-01-21 ENCOUNTER — Encounter: Admitting: General Surgery

## 2024-01-28 ENCOUNTER — Telehealth: Admitting: Physician Assistant

## 2024-01-28 DIAGNOSIS — J019 Acute sinusitis, unspecified: Secondary | ICD-10-CM | POA: Diagnosis not present

## 2024-01-28 DIAGNOSIS — B9689 Other specified bacterial agents as the cause of diseases classified elsewhere: Secondary | ICD-10-CM

## 2024-01-28 MED ORDER — AMOXICILLIN-POT CLAVULANATE 875-125 MG PO TABS: 1.0000 | ORAL_TABLET | Freq: Two times a day (BID) | ORAL | 0 refills | Status: DC

## 2024-01-28 NOTE — Progress Notes (Signed)

## 2024-02-02 ENCOUNTER — Encounter: Payer: Self-pay | Admitting: Family Medicine

## 2024-02-04 ENCOUNTER — Other Ambulatory Visit: Payer: Self-pay | Admitting: Family Medicine

## 2024-02-04 MED ORDER — LEVOTHYROXINE SODIUM 50 MCG PO TABS: 50.0000 ug | ORAL_TABLET | Freq: Every day | ORAL | 3 refills | Status: AC

## 2024-02-04 MED ORDER — AMLODIPINE BESYLATE 5 MG PO TABS: 5.0000 mg | ORAL_TABLET | Freq: Every day | ORAL | 3 refills | Status: AC

## 2024-02-24 NOTE — Progress Notes (Unsigned)
 [facility] [address] [place] [address] Phone: [phone] Subjective:   Brandon Giles, am serving as a scribe for Dr. Arthea Claudene.  I'm seeing this patient by the request  of:  Cook, Jayce G, DO  CC: Back and neck pain  YEP:Dlagzrupcz  Brandon Giles is a 63 y.o. male coming in with complaint of back and neck pain. OMT 11/27/2023. Patient states that 2 weeks ago he was putting insulation in garage door and developed pain in L side of lumbar spine. Pain has been intermittent since injury but will be sharp when it occurs.   Medications patient has been prescribed: Synthroid  Taking:         Reviewed prior external information including notes and imaging from previsou exam, outside providers and external EMR if available.   As well as notes that were available from care everywhere and other healthcare systems.  Past medical history, social, surgical and family history all reviewed in electronic medical record.  No pertanent information unless stated regarding to the chief complaint.   Past Medical History:  Diagnosis Date   Allergy    seasonal   Arthritis    Fingers   Bronchitis 02/2022   GERD (gastroesophageal reflux disease)    HOH (hard of hearing)    left ear   Hypertension    PONV (postoperative nausea and vomiting)    after hernia repair   Pre-diabetes    Shortness of breath    no longer a problem   Sleep apnea    CPAP    Allergies  Allergen Reactions   Hydrocodone -Acetaminophen  Other (See Comments)    Doesn't like how it makes him feel-dizzy,tachycardia     Review of Systems:  No headache, visual changes, nausea, vomiting, diarrhea, constipation, dizziness, abdominal pain, skin rash, fevers, chills, night sweats, weight loss, swollen lymph nodes, body aches, joint swelling, chest pain, shortness of breath, mood changes. POSITIVE muscle aches  Objective  Blood pressure 122/78, pulse 77, height 5' 8  (1.727 m), SpO2 98%.   General: No apparent distress alert and oriented x3 mood and affect normal, dressed appropriately.  HEENT: Pupils equal, extraocular movements intact  Respiratory: Patient's speak in full sentences and does not appear short of breath  Cardiovascular: No lower extremity edema, non tender, no erythema  Gait MSK:  Back does have some loss lordosis.  Severe tenderness to palpation over the sacroiliac joint on the left side.  Positive Faber on the left side.  Loss of lordosis of the back noted.  5 out of 5 strength though of the lower extremities and deep tendon reflexes are intact able to go from seated to standing position without any significant difficulty.  Osteopathic findings  C2 flexed rotated and side bent right C7 flexed rotated and side bent left T9 extended rotated and side bent left inhaled rib L2 flexed rotated and side bent left  Sacrum left on left     Assessment and Plan:  Lumbar pain Seems to be more associated with the low back at the moment.  Seems to be more over the sacroiliac joint.  Discussed with patient about icing regimen, continue core strengthening.  Discussed different medications and patient did not want to add anything at this time.  Can consider advanced imaging and the potential of the sacroiliac injection for diagnostic and therapeutic purposes.  Patient wants to hold and does get some relief with osteopathic manipulation.  Follow-up with me again in 6  to 8 weeks    Nonallopathic problems  Decision today to treat with OMT was based on Physical Exam  After verbal consent patient was treated with HVLA, ME, FPR techniques in cervical, rib, thoracic, lumbar, and sacral  areas  Patient tolerated the procedure well with improvement in symptoms  Patient given exercises, stretches and lifestyle modifications  See medications in patient instructions if given  Patient will follow up in 4-8 weeks     The above documentation has been  reviewed and is accurate and complete Brandon Schnorr M Custer Pimenta, DO         Note: This dictation was prepared with Dragon dictation along with smaller phrase technology. Any transcriptional errors that result from this process are unintentional.

## 2024-02-26 ENCOUNTER — Encounter: Payer: Self-pay | Admitting: Family Medicine

## 2024-02-26 ENCOUNTER — Ambulatory Visit: Admitting: Family Medicine

## 2024-02-26 VITALS — BP 122/78 | HR 77 | Ht 68.0 in

## 2024-02-26 DIAGNOSIS — M9904 Segmental and somatic dysfunction of sacral region: Secondary | ICD-10-CM | POA: Diagnosis not present

## 2024-02-26 DIAGNOSIS — M9902 Segmental and somatic dysfunction of thoracic region: Secondary | ICD-10-CM

## 2024-02-26 DIAGNOSIS — M545 Low back pain, unspecified: Secondary | ICD-10-CM

## 2024-02-26 DIAGNOSIS — M9903 Segmental and somatic dysfunction of lumbar region: Secondary | ICD-10-CM | POA: Diagnosis not present

## 2024-02-26 DIAGNOSIS — M9901 Segmental and somatic dysfunction of cervical region: Secondary | ICD-10-CM | POA: Diagnosis not present

## 2024-02-26 DIAGNOSIS — M9908 Segmental and somatic dysfunction of rib cage: Secondary | ICD-10-CM

## 2024-02-26 NOTE — Assessment & Plan Note (Signed)
 Seems to be more associated with the low back at the moment.  Seems to be more over the sacroiliac joint.  Discussed with patient about icing regimen, continue core strengthening.  Discussed different medications and patient did not want to add anything at this time.  Can consider advanced imaging and the potential of the sacroiliac injection for diagnostic and therapeutic purposes.  Patient wants to hold and does get some relief with osteopathic manipulation.  Follow-up with me again in 6 to 8 weeks

## 2024-02-26 NOTE — Patient Instructions (Signed)
See me in 2-3 months

## 2024-03-24 ENCOUNTER — Other Ambulatory Visit: Payer: 59

## 2024-03-24 DIAGNOSIS — R972 Elevated prostate specific antigen [PSA]: Secondary | ICD-10-CM

## 2024-03-24 DIAGNOSIS — E291 Testicular hypofunction: Secondary | ICD-10-CM

## 2024-03-25 LAB — TESTOSTERONE: Testosterone: 306 ng/dL (ref 264–916)

## 2024-03-25 LAB — PSA: Prostate Specific Ag, Serum: 6.9 ng/mL — ABNORMAL HIGH (ref 0.0–4.0)

## 2024-04-01 ENCOUNTER — Ambulatory Visit: Payer: 59 | Admitting: Urology

## 2024-04-04 ENCOUNTER — Encounter: Payer: Self-pay | Admitting: Family Medicine

## 2024-04-05 ENCOUNTER — Other Ambulatory Visit: Payer: Self-pay

## 2024-04-05 ENCOUNTER — Telehealth: Payer: Self-pay

## 2024-04-05 MED ORDER — PRAVASTATIN SODIUM 40 MG PO TABS: 40.0000 mg | ORAL_TABLET | Freq: Every day | ORAL | 2 refills | Status: DC

## 2024-04-05 NOTE — Progress Notes (Deleted)
 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     Elevated PSA.   PSA is stable with favorable f/t ratio.  Continue to monitor   ED. Contine  Sildenafil  with the addition of tadalafil . .   Plan:    HPI: 12.9.2025:   04/17/23: Brandon Giles today in f/u.  He remains on alfuzosin  and oxybutynin  IR 5mg  at bedtime.  He has sildenafil  and will take up to 3 but isn't sure it is helping enough to make it better than not taking it.  He has reduced sensation with sex. He has persistent frequency and urgency.  His IPSS is 25 with nocturia x 4 as a rule.  He tried to avoid fluids after 7pm.  He has daytime frequency.  His PSA is 6.9 with a favorable f/T ratio of 28.7.  this is in his usual range.   His prostate was 52ml on US  in 2020.  UA is clear.   6/20/24[name] today in /fu.  He is on afluzosin but was out at his last visit.  He is on oxybutynin  at bedtime but still has nocturia.  He does better with the CPAP.  He is doing well with the sildenafil .  He added Saw Palmetto at the advice of his pastor.  He tried Myrbetriq  25mg  but that didn't help.  His IPSS is 28 with nocturia x 5 and frequency.  He has less intermittency with the alfuzosin .  He is primarily drinking water.  He had urodynamics with a capacity of but stability.   His PF was low with elevated voiding pressure and a PVR of .    5/9/24[name] today in f/u.  He had back pain that appears to have been secondary to the tadalafil .  He is doing ok on sildenafil .  He remains on oxybutynin  at bedtime and alfuzosin .  He is doing better with the CPAP and tadalafil  but since he came off of the tadalafil  his nocturia is up to q2hr.   He has high volume voids but he has intermittency with a sensation of incomplete emptying.   His PVR is 13ml.  His UA is clear.   3/28/24[name] today in f/u for his history below.  His PSA is down to 6.0 with a 24% f/t ration from 7.2 in 9/23.  He has BPH with BOO and  nocturia and remains on alfuzosin  and oxybutynin .  His IPSS is 12 which is stable.  He uses tadalafil  5mg  daily and sildenafil  60mg  prn.  He is having some back pain and would like to try to go off of the tadalafil  for a while.    12/2821: Brandon Giles today in f/u for his history of BPH with BOO and LUTS and an elevated PSA with a negative biopsy in 2020.  He had an MRIP in 2/22 that had no high risk changes and a volume of 47ml.   His PSA prior to this visit is 7.2 with a 20% f/t ratio.  The level is stable since 11/21.  He is on alfuzosin  and oxybutynin  at bedtime.  He continues to use tadalafil  5mg  daily which has been helping his nocturia.  He has sildenafil  for his ED but he rarely takes.  He will use 3 tabs with the tadalafil .  He has a history of hypogonadism and his T was 296 on 9/21 with a free T of 53.  He is no longer on clomid or TRT.  His IPSS is  14 which is markedly improved.  He is dealing recently with chronic diarrhea and is being evaluated by GI.   Brandon Giles did a flow rate today with a PF of 61ml/sec and a MF of 50ml/sec with voided.  HIs PVR is .  He continues to have some issues with nocturia and is better with evening fluid restriction but doesn't feel he empties well.    A few days ago, he had some sharp pain in his testes that lasted for about 2 hours.  He noted a little black dot on the scrotum.         ROS:  ROS:  A complete review of systems was performed.  All systems are negative except for pertinent findings as noted.   Review of Systems  All other systems reviewed and are negative.   Allergies  Allergen Reactions   Hydrocodone -Acetaminophen  Other (See Comments)    Doesn't like how it makes him feel-dizzy,tachycardia    Outpatient Encounter Medications as of 04/06/2024  Medication Sig   alfuzosin  (UROXATRAL ) 10 MG 24 hr tablet TAKE 1 TABLET BY MOUTH DAILY  WITH BREAKFAST   amLODipine  (NORVASC ) 5 MG tablet Take 1 tablet (5 mg total) by mouth daily.    amoxicillin -clavulanate (AUGMENTIN ) 875-125 MG tablet Take 1 tablet by mouth 2 (two) times daily.   b complex vitamins capsule Take 1 capsule by mouth daily.   fluticasone (FLONASE) 50 MCG/ACT nasal spray Place 1 spray into both nostrils daily.   levothyroxine  (SYNTHROID ) 50 MCG tablet Take 1 tablet (50 mcg total) by mouth daily before breakfast.   loratadine (CLARITIN REDITABS) 10 MG dissolvable tablet Take 10 mg by mouth daily.   NON FORMULARY Pt uses a cpap nightly   omeprazole (PRILOSEC) 40 MG capsule Take 40 mg by mouth daily.   oxyCODONE  (ROXICODONE ) 5 MG immediate release tablet Take 1 tablet (5 mg total) by mouth every 4 (four) hours as needed for severe pain (pain score 7-10) or breakthrough pain.   pravastatin  (PRAVACHOL ) 40 MG tablet Take 1 tablet (40 mg total) by mouth daily.   sildenafil  (REVATIO ) 20 MG tablet TAKE 3 TO 5 TABLETS BY MOUTH 1 HOUR PRIOR TO INTERCOURSE.   No facility-administered encounter medications on file as of 04/06/2024.    Past Medical History:  Diagnosis Date   Allergy    seasonal   Arthritis    Fingers   Bronchitis 02/2022   GERD (gastroesophageal reflux disease)    HOH (hard of hearing)    left ear   Hypertension    PONV (postoperative nausea and vomiting)    after hernia repair   Pre-diabetes    Shortness of breath    no longer a problem   Sleep apnea    CPAP    Past Surgical History:  Procedure Laterality Date   COLONOSCOPY  07/15/2013   Dr.Brodie   GANGLION CYST EXCISION  12/25/2011   Procedure: REMOVAL GANGLION OF WRIST;  Surgeon: Lynwood Brandon Giles Bern, MD;  Location: [facility];  Service: Orthopedics;  Laterality: Left;  excision of ganglion cyst of left wrist    HERNIA REPAIR  mid '90's   bilateral ing   KNEE ARTHROSCOPY  '99   left    NASAL SEPTOPLASTY W/ TURBINOPLASTY Bilateral 06/04/2013   Procedure: BILATERNAL NASAL SEPTOPLASTY WITH TURBINATE REDUCTION;  Surgeon: Ida Loader, MD;  Location: [facility];  Service: ENT;  Laterality: Bilateral;   SEPTOPLASTY     TONSILLECTOMY  Social History   Socioeconomic History   Marital status: Married    Spouse name: Not on file   Number of children: Not on file   Years of education: Not on file   Highest education level: Associate degree: occupational, scientist, product/process development, or vocational program  Occupational History   Occupation: Retired  Tobacco Use   Smoking status: Never   Smokeless tobacco: Never  Vaping Use   Vaping status: Never Used  Substance and Sexual Activity   Alcohol use: No   Drug use: Not Currently   Sexual activity: Not on file  Other Topics Concern   Not on file  Social History Narrative   Not on file   Social Drivers of Health   Financial Resource Strain: Low Risk  (10/20/2023)   Overall Financial Resource Strain (CARDIA)    Difficulty of Paying Living Expenses: Not hard at all  Food Insecurity: No Food Insecurity (10/20/2023)   Hunger Vital Sign    Worried About Running Out of Food in the Last Year: Never true    Ran Out of Food in the Last Year: Never true  Transportation Needs: No Transportation Needs (10/20/2023)   PRAPARE - Administrator, Civil Service (Medical): No    Lack of Transportation (Non-Medical): No  Physical Activity: Insufficiently Active (10/20/2023)   Exercise Vital Sign    Days of Exercise per Week: 2 days    Minutes of Exercise per Session: 40 min  Stress: No Stress Concern Present (10/20/2023)   [employer] of Occupational Health - Occupational Stress Questionnaire    Feeling of Stress: Not at all  Social Connections: Moderately Integrated (10/20/2023)   Social Connection and Isolation Panel    Frequency of Communication with Friends and Family: Once a week    Frequency of Social Gatherings with Friends and Family: Once a week    Attends Religious Services: More than 4 times per year    Active Member of [employer] or Organizations: Yes    Attends Hospital Doctor: More than 4 times per year    Marital Status: Married  Catering Manager Violence: Not on file    Family History  Problem Relation Age of Onset   Heart disease Mother    Lung cancer Mother    Stroke Mother    Diabetes Father    Colon cancer Neg Hx    Esophageal cancer Neg Hx    Rectal cancer Neg Hx    Stomach cancer Neg Hx    Colon polyps Neg Hx        Objective: There were no vitals filed for this visit.    Physical Exam Vitals reviewed.  Constitutional:      Appearance: Normal appearance.  Neurological:     Mental Status: He is alert.     Lab Results:  PSA PSA  Date Value Ref Range Status  11/27/2023 7.37 (H) 0.10 - 4.00 ng/mL Final    Comment:    Test performed using Access Hybritech PSA Assay, a parmagnetic partical, chemiluminecent immunoassay.  09/11/2023 7.0  Corrected    Comment:    abst by him   Testosterone   Date Value Ref Range Status  03/24/2024 306 264 - 916 ng/dL Final    Comment:    Adult male reference interval is based on a population of healthy nonobese males (BMI <30) between 55 and 36 years old. Travison, et.al. JCEM [phone]. PMID: 71675896.   11/27/2023 177.76 (L) 300.00 - 890.00 ng/dL Final  90/78/7976 703  264 - 916 ng/dL Final    Comment:    Adult male reference interval is based on a population of healthy nonobese males (BMI <30) between 20 and 52 years old. Travison, et.al. JCEM [phone]. PMID: 71675896. **Verified by repeat analysis**    Lab Results  Component Value Date   PSA1 6.9 (H) 03/24/2024    Lab Results  Component Value Date   HCT 47.2 11/27/2023   HCT 46.5 03/20/2020   Lab Results  Component Value Date   HGB 16.1 11/27/2023   HGB 15.7 03/20/2020    UA is unremarkable today.   Studies/Results:

## 2024-04-05 NOTE — Telephone Encounter (Signed)
 Tried calling pt to rescheduled pt appointment without answer. Labs routed to MD

## 2024-04-06 ENCOUNTER — Ambulatory Visit: Admitting: Urology

## 2024-04-06 DIAGNOSIS — R972 Elevated prostate specific antigen [PSA]: Secondary | ICD-10-CM

## 2024-04-06 DIAGNOSIS — R350 Frequency of micturition: Secondary | ICD-10-CM

## 2024-04-06 DIAGNOSIS — R351 Nocturia: Secondary | ICD-10-CM

## 2024-04-06 DIAGNOSIS — N5201 Erectile dysfunction due to arterial insufficiency: Secondary | ICD-10-CM

## 2024-04-06 DIAGNOSIS — N138 Other obstructive and reflux uropathy: Secondary | ICD-10-CM

## 2024-04-11 NOTE — Progress Notes (Unsigned)
 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     Elevated PSA.   PSA is stable with favorable f/t ratio.  Continue to monitor   ED. Contine  Sildenafil  with the addition of tadalafil . .   Plan:    HPI: 12.9.2025:   04/17/23: Brandon Giles returns today in f/u.  He remains on alfuzosin  and oxybutynin  IR 5mg  at bedtime.  He has sildenafil  and will take up to 3 but isn't sure it is helping enough to make it better than not taking it.  He has reduced sensation with sex. He has persistent frequency and urgency.  His IPSS is 25 with nocturia x 4 as a rule.  He tried to avoid fluids after 7pm.  He has daytime frequency.  His PSA is 6.9 with a favorable f/T ratio of 28.7.  this is in his usual range.   His prostate was 52ml on US  in 2020.  UA is clear.   6/20/24[name] returns today in /fu.  He is on afluzosin but was out at his last visit.  He is on oxybutynin  at bedtime but still has nocturia.  He does better with the CPAP.  He is doing well with the sildenafil .  He added Saw Palmetto at the advice of his pastor.  He tried Myrbetriq  25mg  but that didn't help.  His IPSS is 28 with nocturia x 5 and frequency.  He has less intermittency with the alfuzosin .  He is primarily drinking water.  He had urodynamics with a capacity of but stability.   His PF was low with elevated voiding pressure and a PVR of .    5/9/24[name] returns today in f/u.  He had back pain that appears to have been secondary to the tadalafil .  He is doing ok on sildenafil .  He remains on oxybutynin  at bedtime and alfuzosin .  He is doing better with the CPAP and tadalafil  but since he came off of the tadalafil  his nocturia is up to q2hr.   He has high volume voids but he has intermittency with a sensation of incomplete emptying.   His PVR is 13ml.  His UA is clear.   3/28/24[name] returns today in f/u for his history below.  His PSA is down to 6.0 with a 24% f/t ration from 7.2 in 9/23.  He has BPH with BOO and  nocturia and remains on alfuzosin  and oxybutynin .  His IPSS is 12 which is stable.  He uses tadalafil  5mg  daily and sildenafil  60mg  prn.  He is having some back pain and would like to try to go off of the tadalafil  for a while.    12/2821: Brandon Giles returns today in f/u for his history of BPH with BOO and LUTS and an elevated PSA with a negative biopsy in 2020.  He had an MRIP in 2/22 that had no high risk changes and a volume of 47ml.   His PSA prior to this visit is 7.2 with a 20% f/t ratio.  The level is stable since 11/21.  He is on alfuzosin  and oxybutynin  at bedtime.  He continues to use tadalafil  5mg  daily which has been helping his nocturia.  He has sildenafil  for his ED but he rarely takes.  He will use 3 tabs with the tadalafil .  He has a history of hypogonadism and his T was 296 on 9/21 with a free T of 53.  He is no longer on clomid or TRT.  His IPSS is  14 which is markedly improved.  He is dealing recently with chronic diarrhea and is being evaluated by GI.   Brandon Giles did a flow rate today with a PF of 22ml/sec and a MF of 14ml/sec with voided.  HIs PVR is .  He continues to have some issues with nocturia and is better with evening fluid restriction but doesn't feel he empties well.    A few days ago, he had some sharp pain in his testes that lasted for about 2 hours.  He noted a little black dot on the scrotum.         ROS:  ROS:  A complete review of systems was performed.  All systems are negative except for pertinent findings as noted.   Review of Systems  All other systems reviewed and are negative.   Allergies  Allergen Reactions   Hydrocodone -Acetaminophen  Other (See Comments)    Doesn't like how it makes him feel-dizzy,tachycardia    Outpatient Encounter Medications as of 04/13/2024  Medication Sig   alfuzosin  (UROXATRAL ) 10 MG 24 hr tablet TAKE 1 TABLET BY MOUTH DAILY  WITH BREAKFAST   amLODipine  (NORVASC ) 5 MG tablet Take 1 tablet (5 mg total) by mouth daily.    amoxicillin -clavulanate (AUGMENTIN ) 875-125 MG tablet Take 1 tablet by mouth 2 (two) times daily.   b complex vitamins capsule Take 1 capsule by mouth daily.   fluticasone (FLONASE) 50 MCG/ACT nasal spray Place 1 spray into both nostrils daily.   levothyroxine  (SYNTHROID ) 50 MCG tablet Take 1 tablet (50 mcg total) by mouth daily before breakfast.   loratadine (CLARITIN REDITABS) 10 MG dissolvable tablet Take 10 mg by mouth daily.   NON FORMULARY Pt uses a cpap nightly   omeprazole (PRILOSEC) 40 MG capsule Take 40 mg by mouth daily.   oxyCODONE  (ROXICODONE ) 5 MG immediate release tablet Take 1 tablet (5 mg total) by mouth every 4 (four) hours as needed for severe pain (pain score 7-10) or breakthrough pain.   pravastatin  (PRAVACHOL ) 40 MG tablet Take 1 tablet (40 mg total) by mouth daily.   sildenafil  (REVATIO ) 20 MG tablet TAKE 3 TO 5 TABLETS BY MOUTH 1 HOUR PRIOR TO INTERCOURSE.   No facility-administered encounter medications on file as of 04/13/2024.    Past Medical History:  Diagnosis Date   Allergy    seasonal   Arthritis    Fingers   Bronchitis 02/2022   GERD (gastroesophageal reflux disease)    HOH (hard of hearing)    left ear   Hypertension    PONV (postoperative nausea and vomiting)    after hernia repair   Pre-diabetes    Shortness of breath    no longer a problem   Sleep apnea    CPAP    Past Surgical History:  Procedure Laterality Date   COLONOSCOPY  07/15/2013   Dr.Brodie   GANGLION CYST EXCISION  12/25/2011   Procedure: REMOVAL GANGLION OF WRIST;  Surgeon: Lynwood SHAUNNA Bern, MD;  Location: [facility];  Service: Orthopedics;  Laterality: Left;  excision of ganglion cyst of left wrist    HERNIA REPAIR  mid '90's   bilateral ing   KNEE ARTHROSCOPY  '99   left    NASAL SEPTOPLASTY W/ TURBINOPLASTY Bilateral 06/04/2013   Procedure: BILATERNAL NASAL SEPTOPLASTY WITH TURBINATE REDUCTION;  Surgeon: Ida Loader, MD;  Location: [facility];  Service: ENT;  Laterality: Bilateral;   SEPTOPLASTY     TONSILLECTOMY  Social History   Socioeconomic History   Marital status: Married    Spouse name: Not on file   Number of children: Not on file   Years of education: Not on file   Highest education level: Associate degree: occupational, scientist, product/process development, or vocational program  Occupational History   Occupation: Retired  Tobacco Use   Smoking status: Never   Smokeless tobacco: Never  Vaping Use   Vaping status: Never Used  Substance and Sexual Activity   Alcohol use: No   Drug use: Not Currently   Sexual activity: Not on file  Other Topics Concern   Not on file  Social History Narrative   Not on file   Social Drivers of Health   Tobacco Use: Low Risk (02/26/2024)   Patient History    Smoking Tobacco Use: Never    Smokeless Tobacco Use: Never    Passive Exposure: Not on file  Financial Resource Strain: Low Risk (10/20/2023)   Overall Financial Resource Strain (CARDIA)    Difficulty of Paying Living Expenses: Not hard at all  Food Insecurity: No Food Insecurity (10/20/2023)   Epic    Worried About Radiation Protection Practitioner of Food in the Last Year: Never true    Ran Out of Food in the Last Year: Never true  Transportation Needs: No Transportation Needs (10/20/2023)   Epic    Lack of Transportation (Medical): No    Lack of Transportation (Non-Medical): No  Physical Activity: Insufficiently Active (10/20/2023)   Exercise Vital Sign    Days of Exercise per Week: 2 days    Minutes of Exercise per Session: 40 min  Stress: No Stress Concern Present (10/20/2023)   [employer] of Occupational Health - Occupational Stress Questionnaire    Feeling of Stress: Not at all  Social Connections: Moderately Integrated (10/20/2023)   Social Connection and Isolation Panel    Frequency of Communication with Friends and Family: Once a week    Frequency of Social Gatherings with Friends and Family: Once a week    Attends Religious  Services: More than 4 times per year    Active Member of [employer] or Organizations: Yes    Attends Banker Meetings: More than 4 times per year    Marital Status: Married  Catering Manager Violence: Not on file  Depression (PHQ2-9): Low Risk (10/21/2023)   Depression (PHQ2-9)    PHQ-2 Score: 0  Alcohol Screen: Not on file  Housing: Unknown (10/20/2023)   Epic    Unable to Pay for Housing in the Last Year: No    Number of Times Moved in the Last Year: Not on file    Homeless in the Last Year: No  Utilities: Not on file  Health Literacy: Not on file    Family History  Problem Relation Age of Onset   Heart disease Mother    Lung cancer Mother    Stroke Mother    Diabetes Father    Colon cancer Neg Hx    Esophageal cancer Neg Hx    Rectal cancer Neg Hx    Stomach cancer Neg Hx    Colon polyps Neg Hx        Objective: There were no vitals filed for this visit.    Physical Exam Vitals reviewed.  Constitutional:      Appearance: Normal appearance.  Neurological:     Mental Status: He is alert.     Lab Results:  PSA PSA  Date Value Ref Range Status  11/27/2023 7.37 (H)  0.10 - 4.00 ng/mL Final    Comment:    Test performed using Access Hybritech PSA Assay, a parmagnetic partical, chemiluminecent immunoassay.  09/11/2023 7.0  Corrected    Comment:    abst by him   Testosterone   Date Value Ref Range Status  03/24/2024 306 264 - 916 ng/dL Final    Comment:    Adult male reference interval is based on a population of healthy nonobese males (BMI <30) between 63 and 71 years old. Travison, et.al. JCEM [phone]. PMID: 71675896.   11/27/2023 177.76 (L) 300.00 - 890.00 ng/dL Final  90/78/7976 703 735 - 916 ng/dL Final    Comment:    Adult male reference interval is based on a population of healthy nonobese males (BMI <30) between 55 and 90 years old. Travison, et.al. JCEM [phone]. PMID: 71675896. **Verified by repeat analysis**     Lab Results  Component Value Date   PSA1 6.9 (H) 03/24/2024    Lab Results  Component Value Date   HCT 47.2 11/27/2023   HCT 46.5 03/20/2020   Lab Results  Component Value Date   HGB 16.1 11/27/2023   HGB 15.7 03/20/2020    UA is unremarkable today.   Studies/Results:

## 2024-04-13 ENCOUNTER — Ambulatory Visit: Admitting: Urology

## 2024-04-13 VITALS — BP 153/81 | HR 75

## 2024-04-13 DIAGNOSIS — R351 Nocturia: Secondary | ICD-10-CM

## 2024-04-13 DIAGNOSIS — R350 Frequency of micturition: Secondary | ICD-10-CM

## 2024-04-13 DIAGNOSIS — N138 Other obstructive and reflux uropathy: Secondary | ICD-10-CM

## 2024-04-13 DIAGNOSIS — N529 Male erectile dysfunction, unspecified: Secondary | ICD-10-CM

## 2024-04-13 DIAGNOSIS — R972 Elevated prostate specific antigen [PSA]: Secondary | ICD-10-CM

## 2024-04-13 DIAGNOSIS — N5201 Erectile dysfunction due to arterial insufficiency: Secondary | ICD-10-CM

## 2024-04-13 DIAGNOSIS — N401 Enlarged prostate with lower urinary tract symptoms: Secondary | ICD-10-CM

## 2024-04-13 DIAGNOSIS — E291 Testicular hypofunction: Secondary | ICD-10-CM

## 2024-04-13 LAB — URINALYSIS, ROUTINE W REFLEX MICROSCOPIC
Bilirubin, UA: NEGATIVE
Glucose, UA: NEGATIVE
Ketones, UA: NEGATIVE
Leukocytes,UA: NEGATIVE
Nitrite, UA: NEGATIVE
Protein,UA: NEGATIVE
RBC, UA: NEGATIVE
Specific Gravity, UA: <1.005 — ABNORMAL LOW (ref 1.005–1.030)
Urobilinogen, Ur: 0.2 mg/dL (ref 0.2–1.0)
pH, UA: 7 (ref 5.0–7.5)

## 2024-04-13 LAB — BLADDER SCAN AMB NON-IMAGING: Scan Result: 10

## 2024-04-13 MED ORDER — SILDENAFIL CITRATE 20 MG PO TABS: ORAL_TABLET | ORAL | 5 refills | Status: AC

## 2024-04-13 MED ORDER — SOLIFENACIN SUCCINATE 10 MG PO TABS: 10.0000 mg | ORAL_TABLET | Freq: Every day | ORAL | 11 refills | Status: AC

## 2024-04-13 NOTE — Progress Notes (Signed)
 Bladder Scan completed today due to reason of BPH  Patient can void prior to the bladder scan. Bladder scan result: 10  Performed By: Exie T. CMA  Additional notes- Patient is scheduled to follow up with MD

## 2024-04-26 NOTE — Progress Notes (Unsigned)
 " [facility] [address] [place] [address] Phone: [phone] Subjective:   Brandon Giles, am serving as a scribe for Dr. Arthea Giles.  I'm seeing this patient by the request  of:  Cook, Jayce G, DO  CC: Back and neck pain follow-up  YEP:Dlagzrupcz  Brandon Giles is a 63 y.o. male coming in with complaint of back and neck pain. OMT 02/26/2024. Patient states doing well. Very minimal pain today. No new symptoms.  Medications patient has been prescribed: None  Taking:         Reviewed prior external information including notes and imaging from previsou exam, outside providers and external EMR if available.   As well as notes that were available from care everywhere and other healthcare systems.  Past medical history, social, surgical and family history all reviewed in electronic medical record.  No pertanent information unless stated regarding to the chief complaint.   Past Medical History:  Diagnosis Date   Allergy    seasonal   Arthritis    Fingers   Bronchitis 02/2022   GERD (gastroesophageal reflux disease)    HOH (hard of hearing)    left ear   Hypertension    PONV (postoperative nausea and vomiting)    after hernia repair   Pre-diabetes    Shortness of breath    no longer a problem   Sleep apnea    CPAP    Allergies[1]   Review of Systems:  No headache, visual changes, nausea, vomiting, diarrhea, constipation, dizziness, abdominal pain, skin rash, fevers, chills, night sweats, weight loss, swollen lymph nodes, body aches, joint swelling, chest pain, shortness of breath, mood changes. POSITIVE muscle aches but mild  Objective  Blood pressure (!) 140/90, pulse 65, height 5' 8 (1.727 m), weight 210 lb (95.3 kg), SpO2 99%.   General: No apparent distress alert and oriented x3 mood and affect normal, dressed appropriately.  HEENT: Pupils equal, extraocular movements intact  Respiratory: Patient's speak in full  sentences and does not appear short of breath  Cardiovascular: No lower extremity edema, non tender, no erythema  Gait MSK: Low back does have some tightness throughout the lumbar spine.  Osteopathic findings   T9 extended rotated and side bent left L2 flexed rotated and side bent right Sacrum right on right       Assessment and Plan:  Lumbar pain Done relatively well with conservative therapy at this time.  Discussed icing regimen and home exercises, discussed which activities as to avoid.  Increase to increase activity.  We discussed also about patient's weight loss and core strengthening.  Follow-up again in 6 to 12 weeks    Nonallopathic problems  Decision today to treat with OMT was based on Physical Exam  After verbal consent patient was treated with HVLA, ME, FPR techniques in cthoracic, lumbar, and sacral  areas  Patient tolerated the procedure well with improvement in symptoms  Patient given exercises, stretches and lifestyle modifications  See medications in patient instructions if given  Patient will follow up in 12 weeks     The above documentation has been reviewed and is accurate and complete [name], DO         Note: This dictation was prepared with Dragon dictation along with smaller phrase technology. Any transcriptional errors that result from this process are unintentional.            [1]  Allergies Allergen Reactions   Hydrocodone -Acetaminophen  Other (See  Comments)    Doesn't like how it makes him feel-dizzy,tachycardia   "

## 2024-04-27 ENCOUNTER — Encounter: Payer: Self-pay | Admitting: Family Medicine

## 2024-04-27 ENCOUNTER — Ambulatory Visit: Admitting: Family Medicine

## 2024-04-27 VITALS — BP 140/90 | HR 65 | Ht 68.0 in | Wt 210.0 lb

## 2024-04-27 DIAGNOSIS — M9902 Segmental and somatic dysfunction of thoracic region: Secondary | ICD-10-CM | POA: Diagnosis not present

## 2024-04-27 DIAGNOSIS — M9904 Segmental and somatic dysfunction of sacral region: Secondary | ICD-10-CM

## 2024-04-27 DIAGNOSIS — M9903 Segmental and somatic dysfunction of lumbar region: Secondary | ICD-10-CM

## 2024-04-27 DIAGNOSIS — M545 Low back pain, unspecified: Secondary | ICD-10-CM

## 2024-04-27 NOTE — Assessment & Plan Note (Signed)
 Done relatively well with conservative therapy at this time.  Discussed icing regimen and home exercises, discussed which activities as to avoid.  Increase to increase activity.  We discussed also about patient's weight loss and core strengthening.  Follow-up again in 6 to 12 weeks

## 2024-04-27 NOTE — Patient Instructions (Signed)
 65mg  of iron daily

## 2024-05-13 ENCOUNTER — Ambulatory Visit: Admitting: Urology

## 2024-05-13 ENCOUNTER — Other Ambulatory Visit: Payer: Self-pay

## 2024-05-13 VITALS — BP 152/81 | HR 68 | Ht 68.0 in | Wt 210.0 lb

## 2024-05-13 DIAGNOSIS — N138 Other obstructive and reflux uropathy: Secondary | ICD-10-CM

## 2024-05-13 DIAGNOSIS — N401 Enlarged prostate with lower urinary tract symptoms: Secondary | ICD-10-CM | POA: Diagnosis not present

## 2024-05-13 DIAGNOSIS — N3281 Overactive bladder: Secondary | ICD-10-CM

## 2024-05-13 DIAGNOSIS — R310 Gross hematuria: Secondary | ICD-10-CM

## 2024-05-13 DIAGNOSIS — R972 Elevated prostate specific antigen [PSA]: Secondary | ICD-10-CM

## 2024-05-13 LAB — BLADDER SCAN AMB NON-IMAGING: Scan Result: 68

## 2024-05-13 NOTE — Progress Notes (Signed)
" ° °  05/13/2024 9:35 AM   Brandon Giles Dagley 1960/09/25 [id number]  Reason for visit: BPH, OAB, discuss follow-up, ED  History: Has been followed extensively in [place] by Dr. Watt and Dr. Matilda, was referred today to discuss HOLEP Urodynamics have shown bladder outlet obstruction with a small volume bladder, bladder currently managed with alfuzosin  and Vesicare  History of elevated PSA with negative prostate biopsy and negative prostate MRI ED on sildenafil   Physical Exam: BP (!) 152/81 (BP Location: Left Arm, Patient Position: Sitting, Cuff Size: Large)   Pulse 68   Ht 5' 8 (1.727 m)   Wt 210 lb (95.3 kg)   SpO2 96%   BMI 31.93 kg/m   Imaging/labs: I personally viewed and interpreted the CT from April 2023 prostate measures 57 g, as well as the prostate MRI February 2022 showing no worrisome findings  Today: IPSS score today 32, quality-of-life mostly dissatisfied, PVR today normal 68ml Primary urinary complaint is weak stream, frequency, severe urgency, nocturia.  Nocturia improved to 2 times overnight when using CPAP  Plan:   BPH/LUTS: Confirmed outlet obstruction on urodynamics and improvement on alpha blockers previously, likely a component of OAB as well and small capacity bladder contributing to frequency.  He has trialed medications for many years and is at the point where he is interested in outlet procedures.  Risks and benefits of HOLEP were discussed at length, as well as other alternatives like TURP, UroLift, transurethral incision of the prostate.  Agree that HOLEP would have the most definitive impact on his voiding symptoms. We discussed the risks and benefits of HoLEP at length.  The procedure requires general anesthesia and takes 1 to 2 hours, and a holmium laser is used to enucleate the prostate and push this tissue into the bladder.  A morcellator is then used to remove this tissue, which is sent for pathology.  The vast majority(>95%) of patients are  able to discharge the same day with a catheter in place for 2 to 3 days, and will follow-up in clinic for a voiding trial.  We specifically discussed the risks of bleeding, infection, retrograde ejaculation, temporary urgency and urge incontinence, very low risk of long-term incontinence, urethral stricture/bladder neck contracture, pathologic evaluation of prostate tissue and possible detection of prostate cancer or other malignancy, and possible need for additional procedures. We also had a frank conversation about his small capacity bladder and may have some persistent overactive symptoms, frequency, nocturia postop, and may require additional medications like Vesicare , but would recommend a trial off any medications for the first 3 months after surgery.  I do not see that he has had cystoscopy previously, he did not tolerate urodynamics well and defers cystoscopy, understands risk of additional pathology at time of HOLEP that could require different or additional treatment Elevated PSA: History of negative prostate biopsy and negative MRI, will send HOLEP tissue for pathology Schedule HOLEP(57g)  I spent 40 total minutes on the day of the encounter including pre-visit review of the medical record, face-to-face time with the patient, and post visit ordering of labs/imaging/tests.  Extensive review of prior urologic records from outside urologist including testing, imaging, biopsy results, and workup, as well as prior medications trialed.   Redell JAYSON Burnet, MD  [facility] [address], [address] [place], [place] [address] [phone]  "

## 2024-05-13 NOTE — Progress Notes (Signed)
 Surgical Physician Order Form [facility]  Dr. Redell Burnet, MD  * Scheduling expectation : Next Available  *Length of Case: 1.5 hours  *Clearance needed: no  *Anticoagulation Instructions: Hold all anticoagulants  *Aspirin  Instructions: Hold Aspirin   *Post-op visit Date/Instructions:  1-3 day cath removal  *Diagnosis: BPH w/urinary obstruction  *Procedure:  HOLEP (47350)   Additional orders: N/A  -Admit type: OUTpatient  -Anesthesia: General  -VTE Prophylaxis Standing Order SCD's       Other:   -Standing Lab Orders Per Anesthesia    Lab other: UA&Urine Culture  -Standing Test orders EKG/Chest x-ray per Anesthesia       Test other:   - Medications:  Ancef  2gm IV  -Other orders:  N/A

## 2024-05-13 NOTE — Patient Instructions (Signed)

## 2024-05-17 ENCOUNTER — Telehealth: Payer: Self-pay

## 2024-05-17 NOTE — Telephone Encounter (Signed)
 Per Dr. Francisca, Patient is to be scheduled for Holmium Laser Enucleation of the Prostate   Brandon Giles was contacted and possible surgical dates were discussed, Monday March 2nd, 2026 was agreed upon for surgery.   Patient was instructed that Dr. Francisca will require them to provide a pre-op UA & CX prior to surgery. This was ordered and scheduled drop off appointment was made for 06/14/2024.    Patient was directed to call [phone] between 1-3pm the day before surgery to find out surgical arrival time.  Instructions were given not to eat or drink from midnight on the night before surgery and have a driver for the day of surgery. On the surgery day patient was instructed to enter through the [facility] entrance of [facility] report the Same Day Surgery desk.   Pre-Admit Testing will be in contact via phone to set up an interview with the anesthesia team to review your history and medications prior to surgery.   Reminder of this information was sent via MyChart to the patient.

## 2024-05-17 NOTE — Progress Notes (Signed)
" ° °  [facility] Surgical Posting Form  Surgery Date: Date: 06/28/2024  Surgeon: Dr. Redell Burnet, MD  Inpt ( No  )   Outpt (Yes)   Obs ( No  )   Diagnosis: N40.1, N13.8 Benign Prostatic Hyperplasia with Urinary Obstruction  -CPT: [phone]  Surgery: Holmium Laser Enucleation of the Prostate  Stop Anticoagulations: Yes and also hold ASA  Cardiac/Medical/Pulmonary Clearance needed: no  *Orders entered into EPIC  Date: 05/17/24   *Case booked in EPIC  Date: 05/17/24  *Notified pt of Surgery: Date: 05/17/24  PRE-OP UA & CX: yes, will obtain in clinic on 06/14/2024  *Placed into Prior Authorization Work [place] Date: 05/17/24  Assistant/laser/rep:No                "

## 2024-05-23 ENCOUNTER — Encounter: Payer: Self-pay | Admitting: Family Medicine

## 2024-05-24 MED ORDER — PRAVASTATIN SODIUM 40 MG PO TABS: 40.0000 mg | ORAL_TABLET | Freq: Every day | ORAL | 0 refills | Status: AC

## 2024-05-25 NOTE — Telephone Encounter (Signed)
 Pt responded to autumns message understanding he will need to make an appointment w/ Dr.cook

## 2024-06-14 ENCOUNTER — Other Ambulatory Visit

## 2024-06-17 ENCOUNTER — Ambulatory Visit: Admitting: Family Medicine

## 2024-06-28 ENCOUNTER — Ambulatory Visit: Admit: 2024-06-28 | Admitting: Urology

## 2024-06-28 SURGERY — ENUCLEATION, PROSTATE, USING LASER, WITH MORCELLATION
Anesthesia: General

## 2024-06-30 ENCOUNTER — Encounter: Admitting: Physician Assistant
